# Patient Record
Sex: Female | Born: 2000 | Race: Black or African American | Hispanic: No | Marital: Single | Smoking: Never smoker
Health system: Southern US, Community
[De-identification: ages and names within clinical notes are randomized; demographics above are authoritative.]

## PROBLEM LIST (undated history)

## (undated) DIAGNOSIS — R519 Headache, unspecified: Secondary | ICD-10-CM

## (undated) DIAGNOSIS — K589 Irritable bowel syndrome without diarrhea: Secondary | ICD-10-CM

## (undated) DIAGNOSIS — H6690 Otitis media, unspecified, unspecified ear: Secondary | ICD-10-CM

## (undated) DIAGNOSIS — Z889 Allergy status to unspecified drugs, medicaments and biological substances status: Secondary | ICD-10-CM

## (undated) DIAGNOSIS — R51 Headache: Secondary | ICD-10-CM

## (undated) DIAGNOSIS — R011 Cardiac murmur, unspecified: Secondary | ICD-10-CM

## (undated) DIAGNOSIS — J45909 Unspecified asthma, uncomplicated: Secondary | ICD-10-CM

## (undated) HISTORY — PX: TOOTH EXTRACTION: SUR596

## (undated) HISTORY — PX: TYMPANOSTOMY TUBE PLACEMENT: SHX32

---
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
## 2021-09-20 ENCOUNTER — Ambulatory Visit (HOSPITAL_COMMUNITY)
Admission: EM | Admit: 2021-09-20 | Discharge: 2021-09-20 | Disposition: A | Discharge status: Home / Self Care | Payer: Medicaid Other | Attending: Family Medicine | Admitting: Family Medicine

## 2021-09-20 ENCOUNTER — Encounter (HOSPITAL_COMMUNITY): Payer: Self-pay | Admitting: Emergency Medicine

## 2021-09-20 ENCOUNTER — Ambulatory Visit (INDEPENDENT_AMBULATORY_CARE_PROVIDER_SITE_OTHER): Payer: Medicaid Other

## 2021-09-20 DIAGNOSIS — Z20822 Contact with and (suspected) exposure to covid-19: Secondary | ICD-10-CM | POA: Diagnosis not present

## 2021-09-20 DIAGNOSIS — R509 Fever, unspecified: Secondary | ICD-10-CM

## 2021-09-20 DIAGNOSIS — R3129 Other microscopic hematuria: Secondary | ICD-10-CM | POA: Insufficient documentation

## 2021-09-20 DIAGNOSIS — R809 Proteinuria, unspecified: Secondary | ICD-10-CM | POA: Insufficient documentation

## 2021-09-20 DIAGNOSIS — R059 Cough, unspecified: Secondary | ICD-10-CM | POA: Diagnosis not present

## 2021-09-20 DIAGNOSIS — I776 Arteritis, unspecified: Secondary | ICD-10-CM | POA: Diagnosis not present

## 2021-09-20 LAB — CBC WITH DIFFERENTIAL/PLATELET
Abs Immature Granulocytes: 0.01 10*3/uL (ref 0.00–0.07)
Basophils Absolute: 0 10*3/uL (ref 0.0–0.1)
Basophils Relative: 0 %
Eosinophils Absolute: 0.4 10*3/uL (ref 0.0–0.5)
Eosinophils Relative: 7 %
HCT: 44.2 % (ref 36.0–46.0)
Hemoglobin: 14.8 g/dL (ref 12.0–15.0)
Immature Granulocytes: 0 %
Lymphocytes Relative: 8 %
Lymphs Abs: 0.4 10*3/uL — ABNORMAL LOW (ref 0.7–4.0)
MCH: 28.9 pg (ref 26.0–34.0)
MCHC: 33.5 g/dL (ref 30.0–36.0)
MCV: 86.3 fL (ref 80.0–100.0)
Monocytes Absolute: 0.2 10*3/uL (ref 0.1–1.0)
Monocytes Relative: 4 %
Neutro Abs: 4.5 10*3/uL (ref 1.7–7.7)
Neutrophils Relative %: 81 %
Platelets: 219 10*3/uL (ref 150–400)
RBC: 5.12 MIL/uL — ABNORMAL HIGH (ref 3.87–5.11)
RDW: 13.3 % (ref 11.5–15.5)
WBC: 5.6 10*3/uL (ref 4.0–10.5)
nRBC: 0 % (ref 0.0–0.2)

## 2021-09-20 LAB — POCT URINALYSIS DIPSTICK, ED / UC
Glucose, UA: NEGATIVE mg/dL
Ketones, ur: 15 mg/dL — AB
Leukocytes,Ua: NEGATIVE
Nitrite: NEGATIVE
Protein, ur: >=300 mg/dL — AB
Specific Gravity, Urine: >=1.03 (ref 1.005–1.030)
Urobilinogen, UA: 1 mg/dL (ref 0.0–1.0)
pH: 6 (ref 5.0–8.0)

## 2021-09-20 LAB — COMPREHENSIVE METABOLIC PANEL
ALT: 15 U/L (ref 0–44)
AST: 31 U/L (ref 15–41)
Albumin: 4.5 g/dL (ref 3.5–5.0)
Alkaline Phosphatase: 63 U/L (ref 38–126)
Anion gap: 12 (ref 5–15)
BUN: 14 mg/dL (ref 6–20)
CO2: 19 mmol/L — ABNORMAL LOW (ref 22–32)
Calcium: 9 mg/dL (ref 8.9–10.3)
Chloride: 104 mmol/L (ref 98–111)
Creatinine, Ser: 1.64 mg/dL — ABNORMAL HIGH (ref 0.44–1.00)
GFR, Estimated: 45 mL/min — ABNORMAL LOW (ref >60)
Glucose, Bld: 96 mg/dL (ref 70–99)
Potassium: 3.7 mmol/L (ref 3.5–5.1)
Sodium: 135 mmol/L (ref 135–145)
Total Bilirubin: 0.6 mg/dL (ref 0.3–1.2)
Total Protein: 7.9 g/dL (ref 6.5–8.1)

## 2021-09-20 LAB — TSH: TSH: 0.155 u[IU]/mL — ABNORMAL LOW (ref 0.350–4.500)

## 2021-09-20 LAB — C-REACTIVE PROTEIN: CRP: 5 mg/dL — ABNORMAL HIGH (ref <1.0)

## 2021-09-20 MED ORDER — ACETAMINOPHEN 325 MG PO TABS
975.0000 mg | ORAL_TABLET | Freq: Once | ORAL | Status: AC
  Administered 2021-09-20: 975 mg via ORAL

## 2021-09-20 MED ORDER — ACETAMINOPHEN 325 MG PO TABS
ORAL_TABLET | ORAL | Status: AC
Start: 2021-09-20 — End: ?
  Filled 2021-09-20: qty 3

## 2021-09-20 MED ORDER — ACETAMINOPHEN 325 MG PO TABS
ORAL_TABLET | ORAL | Status: AC
  Filled 2021-09-20: qty 3

## 2021-09-20 NOTE — ED Triage Notes (Signed)
Pt reports was seen at [facility] on Friday for breakout on her back and was started on antibiotics. Reports that she started having body aches, chills, coughing yesterday. Called them back and was told that the symptoms were not related to antibiotics. Took tylenol and Mucinex yesterday.  ?

## 2021-09-20 NOTE — ED Provider Notes (Addendum)
?[place] ? ? ?[id number] ?09/20/21 Arrival Time: [id number] ? ?ASSESSMENT & PLAN: ? 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 Fever, unspecified fever cause   ?3. Proteinuria, unspecified type   ?4. Other microscopic hematuria   ? ?Without mucous membrane involvement. ?Will stop what I assume is Bactrim given her description of name of antibiotic. ? ?Odd distribution. Unclear etiology; question Ig A related. Less likely given age though. Without mucous membrane involvement, I'm not suspicious of SJS; rash also present before starting antibiotic. No eye involvement. Agrees to ED eval should her rash continue to worsen. ? ?Discussed need for PCP, esp to f/u on current issues and possible hyperthyroidism. ? ?Possible kidney involvement given elevated Cr. CRP elevated. Going to send in a Rx of prednisone. ?Since she does not have a PCP, she may return to UC within few days to recheck BMP and U/A. ?Ensure adeq hydration. ? ?I have personally viewed the imaging studies ordered this visit. ?CXR without acute changes. ? ?Tylenol only for fever control. ? ?Labs Reviewed  ?CBC WITH DIFFERENTIAL/PLATELET - Abnormal; Notable for the following components:  ?    Result Value  ? RBC 5.12 (*)   ? Lymphs Abs 0.4 (*)   ? All other components within normal limits  ?COMPREHENSIVE METABOLIC PANEL - Abnormal; Notable for the following components:  ? CO2 19 (*)   ? Creatinine, Ser 1.64 (*)   ? GFR, Estimated 45 (*)   ? All other components within normal limits  ?TSH - Abnormal; Notable for the following components:  ? TSH 0.155 (*)   ? All other components within normal limits  ?C-REACTIVE PROTEIN - Abnormal; Notable for the following components:  ? CRP 5.0 (*)   ? All other components within normal limits  ?POCT URINALYSIS DIPSTICK, ED / UC - Abnormal; Notable for the following components:  ? Bilirubin Urine MODERATE (*)   ? Ketones, ur 15 (*)   ? Hgb urine dipstick MODERATE (*)   ? Protein, ur >=300 (*)   ? All other components within normal  limits  ?SARS CORONAVIRUS 2 (TAT 6-24 HRS)  ?URINE CULTURE  ? ?Will have RN inform her of above results. ? ?Will follow up with PCP or here if worsening or failing to improve as anticipated. ?Reviewed expectations re: course of current medical issues. Questions answered. ?Outlined signs and symptoms indicating need for more acute intervention. ?Patient verbalized understanding. ?After Visit Summary given. ? ? ?SUBJECTIVE: ? ?Veronica Casey is a 21 y.o. female who presents with a skin complaint. "Rash"; x 1 week; over entire back and part of upper arms. No assoc pain or itching. Seen at [facility] and given an antibiotic for skin infection. "Starts with an 'S'"; does not have medication with her. Feels this has helped a little; has completed approx one week. Questions some new areas on arms. Subj fever today prompting her to come in. No oral or vaginal lesions. No other new medications. Without n/v/d. No h/o similar. Normal bowel/bladder habits. No CP/SOB. ?  ?OBJECTIVE: ?Vitals:  ? 09/20/21 1615  ?BP: 102/69  ?Pulse: (!) 126  ?Resp: 18  ?Temp: (!) 103.1 ?F (39.5 ?C)  ?TempSrc: Oral  ?SpO2: 97%  ?  ?Febrile. Tachycardia noted. ? ?General appearance: alert; no distress ?HEENT: [place]; AT ?Neck: supple with FROM ?Lungs: clear to auscultation bilaterally ?Heart: regular rate and rhythm ?Extremities: no edema; moves all extremities normally ?Skin:  ? ? ?Diffuse red/purple purpuric rash as shown above; non-tender ?Psychological: alert and cooperative;  normal mood and affect ? ?Allergies  ?Allergen Reactions  ? Ceftin [Cefuroxime Axetil] Anaphylaxis  ? Penicillins Anaphylaxis  ? Augmentin [Amoxicillin-Pot Clavulanate] Rash  ? ? ?Past Medical History:  ?Diagnosis Date  ? Asthma   ? Headache   ? Heart murmur   ? IBS (irritable bowel syndrome)   ? Multiple allergies   ? Otitis   ? ?Social History  ? ?Socioeconomic History  ? Marital status: Single  ?  Spouse name: Not on file  ? Number of children: Not on file  ? Years of  education: Not on file  ? Highest education level: Not on file  ?Occupational History  ? Not on file  ?Tobacco Use  ? Smoking status: Passive Smoke Exposure - Never Smoker  ? Smokeless tobacco: Not on file  ?Substance and Sexual Activity  ? Alcohol use: No  ? Drug use: No  ? Sexual activity: Not on file  ?Other Topics Concern  ? Not on file  ?Social History Narrative  ? Not on file  ? ?Social Determinants of Health  ? ?Financial Resource Strain: Not on file  ?Food Insecurity: Not on file  ?Transportation Needs: Not on file  ?Physical Activity: Not on file  ?Stress: Not on file  ?Social Connections: Not on file  ?Intimate Partner Violence: Not on file  ? ?Family History  ?Problem Relation Age of Onset  ? Cancer Maternal Grandmother   ? Heart disease Maternal Grandfather   ? Cancer Paternal Grandmother   ? ?Past Surgical History:  ?Procedure Laterality Date  ? TOOTH EXTRACTION    ? TYMPANOSTOMY TUBE PLACEMENT    ? ? ?  ?Vanessa Kick, MD ?09/21/21 1029 ? ?  ?Vanessa Kick, MD ?09/21/21 1031 ? ?

## 2021-09-20 NOTE — Discharge Instructions (Signed)
You have had labs (blood work and a urine culture) sent today. We will call you with any significant abnormalities or if there is need to begin or change treatment or pursue further follow up.  You may also review your test results online through MyChart. If you do not have a MyChart account, instructions to sign up should be on your discharge paperwork.  

## 2021-09-21 ENCOUNTER — Encounter (HOSPITAL_COMMUNITY): Payer: Self-pay | Admitting: Emergency Medicine

## 2021-09-21 ENCOUNTER — Other Ambulatory Visit: Payer: Self-pay

## 2021-09-21 ENCOUNTER — Emergency Department (HOSPITAL_COMMUNITY): Payer: Medicaid Other

## 2021-09-21 ENCOUNTER — Emergency Department (HOSPITAL_COMMUNITY)
Admission: EM | Admit: 2021-09-21 | Discharge: 2021-09-21 | Discharge status: Against Medical Advice | Payer: Medicaid Other | Attending: Emergency Medicine | Admitting: Emergency Medicine

## 2021-09-21 ENCOUNTER — Telehealth: Payer: Self-pay | Admitting: Family Medicine

## 2021-09-21 DIAGNOSIS — N179 Acute kidney failure, unspecified: Secondary | ICD-10-CM | POA: Diagnosis not present

## 2021-09-21 DIAGNOSIS — R0602 Shortness of breath: Secondary | ICD-10-CM | POA: Insufficient documentation

## 2021-09-21 DIAGNOSIS — J45909 Unspecified asthma, uncomplicated: Secondary | ICD-10-CM | POA: Insufficient documentation

## 2021-09-21 DIAGNOSIS — R21 Rash and other nonspecific skin eruption: Secondary | ICD-10-CM | POA: Insufficient documentation

## 2021-09-21 DIAGNOSIS — Z7722 Contact with and (suspected) exposure to environmental tobacco smoke (acute) (chronic): Secondary | ICD-10-CM | POA: Insufficient documentation

## 2021-09-21 DIAGNOSIS — R000 Tachycardia, unspecified: Secondary | ICD-10-CM | POA: Diagnosis not present

## 2021-09-21 LAB — CBC WITH DIFFERENTIAL/PLATELET
Abs Immature Granulocytes: 0 10*3/uL (ref 0.00–0.07)
Basophils Absolute: 0 10*3/uL (ref 0.0–0.1)
Basophils Relative: 1 %
Eosinophils Absolute: 0 10*3/uL (ref 0.0–0.5)
Eosinophils Relative: 2 %
HCT: 38.6 % (ref 36.0–46.0)
Hemoglobin: 13.1 g/dL (ref 12.0–15.0)
Immature Granulocytes: 0 %
Lymphocytes Relative: 22 %
Lymphs Abs: 0.4 10*3/uL — ABNORMAL LOW (ref 0.7–4.0)
MCH: 29.2 pg (ref 26.0–34.0)
MCHC: 33.9 g/dL (ref 30.0–36.0)
MCV: 86 fL (ref 80.0–100.0)
Monocytes Absolute: 0.1 10*3/uL (ref 0.1–1.0)
Monocytes Relative: 5 %
Neutro Abs: 1.2 10*3/uL — ABNORMAL LOW (ref 1.7–7.7)
Neutrophils Relative %: 70 %
Platelets: 205 10*3/uL (ref 150–400)
RBC: 4.49 MIL/uL (ref 3.87–5.11)
RDW: 13.2 % (ref 11.5–15.5)
WBC: 1.7 10*3/uL — ABNORMAL LOW (ref 4.0–10.5)
nRBC: 0 % (ref 0.0–0.2)

## 2021-09-21 LAB — COMPREHENSIVE METABOLIC PANEL
ALT: 19 U/L (ref 0–44)
AST: 35 U/L (ref 15–41)
Albumin: 4.3 g/dL (ref 3.5–5.0)
Alkaline Phosphatase: 58 U/L (ref 38–126)
Anion gap: 9 (ref 5–15)
BUN: 14 mg/dL (ref 6–20)
CO2: 21 mmol/L — ABNORMAL LOW (ref 22–32)
Calcium: 8.6 mg/dL — ABNORMAL LOW (ref 8.9–10.3)
Chloride: 105 mmol/L (ref 98–111)
Creatinine, Ser: 1.54 mg/dL — ABNORMAL HIGH (ref 0.44–1.00)
GFR, Estimated: 48 mL/min — ABNORMAL LOW (ref >60)
Glucose, Bld: 105 mg/dL — ABNORMAL HIGH (ref 70–99)
Potassium: 3.5 mmol/L (ref 3.5–5.1)
Sodium: 135 mmol/L (ref 135–145)
Total Bilirubin: 0.7 mg/dL (ref 0.3–1.2)
Total Protein: 7.2 g/dL (ref 6.5–8.1)

## 2021-09-21 LAB — URINALYSIS, ROUTINE W REFLEX MICROSCOPIC
Bacteria, UA: NONE SEEN
Bilirubin Urine: NEGATIVE
Glucose, UA: NEGATIVE mg/dL
Ketones, ur: 5 mg/dL — AB
Leukocytes,Ua: NEGATIVE
Nitrite: NEGATIVE
Protein, ur: 30 mg/dL — AB
Specific Gravity, Urine: 1.003 — ABNORMAL LOW (ref 1.005–1.030)
pH: 6 (ref 5.0–8.0)

## 2021-09-21 LAB — I-STAT BETA HCG BLOOD, ED (MC, WL, AP ONLY): I-stat hCG, quantitative: <5 m[IU]/mL (ref <5)

## 2021-09-21 LAB — APTT: aPTT: 41 seconds — ABNORMAL HIGH (ref 24–36)

## 2021-09-21 LAB — SARS CORONAVIRUS 2 (TAT 6-24 HRS): SARS Coronavirus 2: NEGATIVE

## 2021-09-21 LAB — C-REACTIVE PROTEIN: CRP: 2.9 mg/dL — ABNORMAL HIGH (ref <1.0)

## 2021-09-21 LAB — SEDIMENTATION RATE: Sed Rate: 5 mm/hr (ref 0–22)

## 2021-09-21 LAB — LACTIC ACID, PLASMA: Lactic Acid, Venous: 0.9 mmol/L (ref 0.5–1.9)

## 2021-09-21 MED ORDER — LACTATED RINGERS IV BOLUS (SEPSIS)
1000.0000 mL | Freq: Once | INTRAVENOUS | Status: AC
  Administered 2021-09-21: 1000 mL via INTRAVENOUS

## 2021-09-21 MED ORDER — METHYLPREDNISOLONE SODIUM SUCC 125 MG IJ SOLR
125.0000 mg | Freq: Once | INTRAMUSCULAR | Status: AC
  Administered 2021-09-21: 125 mg via INTRAVENOUS
  Filled 2021-09-21: qty 2

## 2021-09-21 MED ORDER — PREDNISONE 20 MG PO TABS: 40.0000 mg | ORAL_TABLET | Freq: Every day | ORAL | 0 refills | Status: DC

## 2021-09-21 MED ORDER — ACETAMINOPHEN 325 MG PO TABS
650.0000 mg | ORAL_TABLET | Freq: Once | ORAL | Status: AC
Start: 2021-09-21 — End: 2021-09-21
  Administered 2021-09-21: 650 mg via ORAL
  Filled 2021-09-21: qty 2

## 2021-09-21 NOTE — ED Provider Notes (Signed)
?[place] ?Provider Note ? ? ?CSN: [id number] ?Arrival date & time: 09/21/21  1044 ? ?  ? ?History ?Chief Complaint  ?Patient presents with  ? Shortness of Breath  ? ? 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  Patient was initially seen and evaluated for this rash that started on her upper back at urgent care.  She was given Bactrim.  After about a week.  She started having worsening symptoms including fevers and chills.  The rash is since progressed to the entire trunk of her body down the upper extremities and starting to progress to the lower extremities.  She was seen at urgent care again yesterday and had labs done.  I reviewed these labs which showed no leukocytosis.  CMP did show evidence of AKI.  TSH was low.  C-reactive protein was elevated.  Urinalysis showed bilirubinuria and moderate mount of blood.  No signs of infection. ? ?Patient presented because she is not feeling any better.  She says the rash is getting worse.  Upon a parking her car and coming to the emergency department to get checked and she became short of breath with exertion.  No chest pain.  She denies any abdominal pain, nausea, vomiting, diarrhea.  No sick contacts.  No new soaps or detergents or medications or foods. ? ? ?Shortness of Breath ? ?  ? ?Home Medications ?Prior to Admission medications   ?Medication Sig Start Date End Date Taking? Authorizing Provider  ?albuterol (PROVENTIL HFA;VENTOLIN HFA) 108 (90 BASE) MCG/ACT inhaler Inhale 1-2 puffs into the lungs every 6 (six) hours as needed for wheezing or shortness of breath.    [provider]  ?fluconazole (DIFLUCAN) 150 MG tablet Take 1 tablet (150 mg total) by mouth daily. 07/16/14   Jaclyn Prime, Collene Leyden, PA-C  ?fluticasone (FLONASE) 50  MCG/ACT nasal spray Place 1 spray into both nostrils daily as needed for allergies or rhinitis.    [provider]  ?loratadine (CLARITIN) 10 MG tablet Take 10 mg by mouth daily.    [provider]  ?Pediatric Multiple Vitamins (CHEWABLE MULTIPLE VITAMINS PO) Take 1 tablet by mouth daily.    [provider]  ?predniSONE (DELTASONE) 20 MG tablet Take 2 tablets (40 mg total) by mouth daily. 09/21/21   Vanessa Kick, MD  ?   ? ?Allergies    ?Ceftin [cefuroxime axetil], Penicillins, and Augmentin [amoxicillin-pot clavulanate]   ? ?Review of Systems   ?Review of Systems  ?Respiratory:  Positive for shortness of breath.   ?All other systems reviewed and are negative. ? ?Physical Exam ?Updated Vital Signs ?BP 100/87 (BP Location: Left Arm)   Pulse (!) 129   Temp (!) 101.2 ?F (38.4 ?C) (Oral)   Resp (!) 24   Ht 5' 2"  (1.575 m)   Wt 77.9 kg   LMP 09/15/2021   SpO2 100%   BMI 31.41 kg/m?  ?Physical Exam ?Vitals and nursing note reviewed.  ?Constitutional:   ?   General: She is in acute distress.  ?   Appearance: Normal appearance.  ?HENT:  ?   Head: Normocephalic and atraumatic.  ?   Mouth/Throat:  ?   Comments: No lesions over the oral mucosa. ?Eyes:  ?   General:     ?   Right eye: No  discharge.     ?   Left eye: No discharge.  ?   Comments: No lesions over the conjunctivae.  ?Cardiovascular:  ?   Rate and Rhythm: Tachycardia present.  ?   Comments: S1/S2 are distinct without any evidence of murmur, rubs, or gallops.  Radial pulses are 2+ bilaterally.  Dorsalis pedis pulses are 2+ bilaterally.  No evidence of pedal edema. ?Pulmonary:  ?   Effort: Tachypnea present.  ?   Breath sounds: Normal breath sounds.  ?Abdominal:  ?   General: Abdomen is flat. Bowel sounds are normal. There is no distension.  ?   Tenderness: There is no abdominal tenderness. There is no guarding or rebound.  ?Musculoskeletal:     ?   General: Normal range of motion.  ?   Cervical back: Neck supple.  ?Skin: ?    General: Skin is warm and dry.  ?   Findings: No rash.  ?   Comments: Erythematous nonpurulent, nontender, and nonpruritic papules scattered along the trunk.  There is evidence of spread however these are more macular and erythematous along the extremities.  ?Neurological:  ?   General: No focal deficit present.  ?   Mental Status: She is alert.  ?Psychiatric:     ?   Mood and Affect: Mood normal.     ?   Behavior: Behavior normal.  ? ? ? ? ? ? ? ? ? ? ?ED Results / Procedures / Treatments   ?Labs ?(all labs ordered are listed, but only abnormal results are displayed) ?Labs Reviewed  ?CULTURE, BLOOD (ROUTINE X 2)  ?CULTURE, BLOOD (ROUTINE X 2)  ?URINE CULTURE  ?LACTIC ACID, PLASMA  ?LACTIC ACID, PLASMA  ?CBC WITH DIFFERENTIAL/PLATELET  ?APTT  ?URINALYSIS, ROUTINE W REFLEX MICROSCOPIC  ?C-REACTIVE PROTEIN  ?SEDIMENTATION RATE  ?I-STAT BETA HCG BLOOD, ED ([facility], [facility], [facility] ONLY)  ?I-STAT BETA HCG BLOOD, ED ([facility], [facility], [facility] ONLY)  ? ? ?EKG ?None ? ?Radiology ?DG Chest 2 View ? ?Result Date: 09/20/2021 ?CLINICAL DATA:  Fever and cough. EXAM: CHEST - 2 VIEW COMPARISON:  None. FINDINGS: Cardiac silhouette and mediastinal contours are within normal limits. The lungs are clear. No pleural effusion or pneumothorax. No acute skeletal abnormality. IMPRESSION: No active cardiopulmonary disease. Electronically Signed   By: Yvonne Kendall M.D.   On: 09/20/2021 17:22   ? ?Procedures ?Procedures  ? ? ?Medications Ordered in ED ?Medications  ?lactated ringers bolus 1,000 mL (has no administration in time range)  ?acetaminophen (TYLENOL) tablet 650 mg (has no administration in time range)  ?methylPREDNISolone sodium succinate (SOLU-MEDROL) 125 mg/2 mL injection 125 mg (has no administration in time range)  ? ? ?ED Course/ Medical Decision Making/ A&P ?Clinical Course as of 09/21/21 1500  ?Thu Sep 21, 2021  ?1403 On reevaluation, patient was sleeping in no acute distress.  Vital signs have completely normalized.  Tachycardia has improved and heart  rate is currently in the 90s.  Respirations are normal.  Blood pressure continues to be normal. [CF]  ?  ?Clinical Course User Index ?[CF] Myna Bright M, PA-C  ? ?                        ?Medical Decision Making ?Amount and/or Complexity of Data Reviewed ?Labs: ordered. ?Radiology: ordered. ?ECG/medicine tests: ordered. ? ?Risk ?OTC drugs. ?Prescription drug management. ? ? ?This patient presents to the ED for concern of fever and rash, this involves an extensive number of treatment options, and  is a complaint that carries with it a high risk of complications and morbidity.  The differential diagnosis includes vasculitis, viral exanthem, dermatitis. ? ? ?Co morbidities that complicate the patient evaluation ? ?Past Medical History:  ?Diagnosis Date  ? Asthma   ? Headache   ? Heart murmur   ? IBS (irritable bowel syndrome)   ? Multiple allergies   ? Otitis   ? ? ?Additional history obtained: ? ?Additional history obtained from nursing note ?External records from outside source obtained and reviewed including urgent care note from yesterday which is highlighted in the HPI. ? ? ?Lab Tests: ? ?I Ordered, and personally interpreted labs.  The pertinent results include: CRP is 2.9 today but downtrending in comparison to yesterday's.  Initial coagulation studies are prolonged.  CMP shows elevated glucose and creatinine but creatinine is better in comparison to yesterday's.  ESR is normal.  Pregnancy negative.  Initial lactic normal.  CBC is pending.  Secondary lactic is pending.  Blood cultures were obtained and are in process. ? ? ?Imaging Studies ordered: ? ?I ordered imaging studies including chest x-ray ?I independently visualized and interpreted imaging which showed no acute findings ?I agree with the radiologist interpretation ? ? ?Cardiac Monitoring: ? ?The patient was maintained on a cardiac monitor.  I personally viewed and interpreted the cardiac monitored which showed an underlying rhythm of: Normal sinus  rhythm ? ? ?Medicines ordered and prescription drug management: ? ?I ordered medication including Solu-Medrol for rash, fluids for fever and tachycardia ?Reevaluation of the patient after these medicines showed that the pa

## 2021-09-21 NOTE — ED Notes (Signed)
Ambulated to the restroom but no sample provided  ?

## 2021-09-21 NOTE — ED Provider Notes (Signed)
Care of patient handed of to me at this time from [place], [place]. See his note for work-up thus far. Briefly, this is a 21 year old female who presents to the emergency department with rash, fever and chills. She was seen at [facility] on 09/13/21 with rash on her back that started after receiving treatment of her keloids. They diagnosed her with bacterial folliculitis and she was started on Bactrim. She states that the rash worsened after starting bactrim, she went back to urgent care yesterday and they told her to stop Bactrim and questioned whether she had vasculitis. Yesterday she had elevated CRP, AKI. Instructed to present to ED if symptoms worsened and started her on prednisone. She states today she has worsening of her rash, fever, and had shortness of breath this morning. Presented here with 101 fever, tachycardia. ?Physical Exam  ?BP 92/67   Pulse 96   Temp 99.5 ?F (37.5 ?C) (Oral)   Resp (!) 22   Ht _0  (1.575 m)   Wt 77.9 kg   LMP 09/15/2021 (Approximate)   SpO2 100%   BMI 31.41 kg/m?  ? ?Physical Exam ?Vitals and nursing note reviewed.  ?Constitutional:   ?   General: She is not in acute distress. ?   Appearance: Normal appearance. She is well-developed. She is obese. She is not toxic-appearing.  ?HENT:  ?   Head: Normocephalic.  ?   Mouth/Throat:  ?   Mouth: Mucous membranes are moist.  ?   Pharynx: Oropharynx is clear.  ?   Comments: No oral lesions  ?Eyes:  ?   General: No scleral icterus. ?   Extraocular Movements: Extraocular movements intact.  ?   Pupils: Pupils are equal, round, and reactive to light.  ?Cardiovascular:  ?   Rate and Rhythm: Normal rate and regular rhythm.  ?   Pulses: Normal pulses.  ?   Heart sounds: No murmur heard. ?Pulmonary:  ?   Effort: Pulmonary effort is normal. No tachypnea.  ?   Breath sounds: Normal breath sounds. No stridor.  ?Abdominal:  ?   General: Bowel sounds are normal.  ?   Palpations: Abdomen is soft.  ?Musculoskeletal:     ?   General: Normal range of motion.   ?   Cervical back: Neck supple.  ?   Right lower leg: No edema.  ?   Left lower leg: No edema.  ?Skin: ?   General: Skin is warm and dry.  ?   Capillary Refill: Capillary refill takes less than 2 seconds.  ?   Findings: Erythema and rash present.  ?   Comments: Diffuse maculopapular rash to the trunk, arms and legs. No involvement of mucous membranes   ?Neurological:  ?   General: No focal deficit present.  ?   Mental Status: She is alert and oriented to person, place, and time.  ?Psychiatric:     ?   Mood and Affect: Mood normal.     ?   Behavior: Behavior normal.  ? ? ?Procedures  ?Procedures ? ?ED Course / MDM  ? ?Clinical Course as of 09/21/21 2015  ?Thu Sep 21, 2021  ?1403 On reevaluation, patient was sleeping in no acute distress.  Vital signs have completely normalized.  Tachycardia has improved and heart rate is currently in the 90s.  Respirations are normal.  Blood pressure continues to be normal. [CF]  ?1821 CBC with Differential [LA]  ?  ?Clinical Course User Index ?[CF] Hendricks Limes, PA-C ?[LA] Mickie Hillier,  PA-C  ? ?Medical Decision Making ?Amount and/or Complexity of Data Reviewed ?Labs: ordered. Decision-making details documented in ED Course. ?Radiology: ordered. ?ECG/medicine tests: ordered. ? ?Risk ?OTC drugs. ?Prescription drug management. ? ? ?21 year old female who presents to the emergency department with rash, fever, shortness of breath. Progressing symptoms over 2 weeks. ?I reviewed her dermatology records from [facility]. It appears she was started on Kenalog injections, flagyl gel, doxycycline PO, hydroquinone cream, tranexamic acid.  ? ?Labs here show ?AKI with Cr 1.54, UA with protein  ?Elevated CRP 2.9, ESR normal  ?Lactic 0.9 ?CBC with  ? ?Rash  ?- ? Drug interaction from dermatology  ?- ? Vasculitis, she has renal involvement. AKI could be from insensible losses but skeptical.  ? ?Given LR, solumedrol and tylenol  ? ?2010: CBC with leukopenia. ?2015: spoke with Sheran Luz, MD  hospitalist who will come evaluate the patient but requests contact with [facility] as we do not have dermatology or rheumatology here at [facility].  ?2045: Contacted [facility] who state that they do not have a bed available. ?2100: Contacted [facility] and spoke with a triage nurse who states that they will call me back ?2130: Patient requesting to leave.  I had a very long discussion with her about the risks of this and that we do not fully know what is going on with her rash and kidney injury.  Discussed that it is likely something autoimmune and she needs further care from her rheumatologist in a dermatologist which we do not have here.  I have asked that she think about the decision to leave and that she will have to leave [place] if she so chooses. ?1000: Patient request to leave [place]. ?The patient has decided to leave against medical advice.  ?The patient had a normal mental status examination and understands his/ ?her condition and the risks of leaving including worsening of her condition or permanent disability and death. The patient has had an opportunity to ask questions about his/her medical condition. The patient has been informed that he/she may return for care at any time and has been referred to his/her primary care physician and dermatologist. ?  ?Mickie Hillier, PA-C ?09/21/21 2205 ? ?  ?Tegeler, Gwenyth Allegra, MD ?09/22/21 0023 ? ?

## 2021-09-21 NOTE — ED Triage Notes (Signed)
Pt seen at [facility] yesterday due possible allergic reaction to antibiotics. Pt reports increased ShOB and rash today, pt unable to speak in full sentences in triage.  ?

## 2021-09-21 NOTE — ED Notes (Signed)
Patient is resting in bed eating dinner provided by family. Patient is alert and oriented and calm. Call bell remains in reach.  ?

## 2021-09-21 NOTE — Discharge Instructions (Signed)
You are seen in the department today for a rash.  While you are here we did a work-up that showed that you also have an injury to your kidneys.  You may have an autoimmune condition where your body is attacking your kidneys for an unknown reason.  We were going to transfer you to a facility that has a dermatologist and rheumatologist stated that you can get the care you need.  You requested to leave AGAINST MEDICAL ADVICE.  We had a long discussion about the risks of this including worsening of your condition or death.  Please return for any concerns. ?

## 2021-09-21 NOTE — Telephone Encounter (Signed)
Sending in Rx prednisone. ?

## 2021-09-22 LAB — URINE CULTURE: Culture: NO GROWTH

## 2021-09-28 LAB — CULTURE, BLOOD (ROUTINE X 2)
Culture: NO GROWTH
Culture: NO GROWTH

## 2021-10-04 ENCOUNTER — Ambulatory Visit (HOSPITAL_COMMUNITY)
Admission: RE | Admit: 2021-10-04 | Discharge: 2021-10-04 | Disposition: A | Discharge status: Home / Self Care | Payer: Medicaid Other | Source: Ambulatory Visit | Attending: Family Medicine | Admitting: Family Medicine

## 2021-10-04 ENCOUNTER — Encounter (HOSPITAL_COMMUNITY): Payer: Self-pay

## 2021-10-04 ENCOUNTER — Other Ambulatory Visit: Payer: Self-pay

## 2021-10-04 VITALS — BP 115/76 | HR 64 | Temp 98.7°F | Resp 20

## 2021-10-04 DIAGNOSIS — I776 Arteritis, unspecified: Secondary | ICD-10-CM | POA: Insufficient documentation

## 2021-10-04 LAB — COMPREHENSIVE METABOLIC PANEL
ALT: 24 U/L (ref 0–44)
AST: 16 U/L (ref 15–41)
Albumin: 4.6 g/dL (ref 3.5–5.0)
Alkaline Phosphatase: 62 U/L (ref 38–126)
Anion gap: 9 (ref 5–15)
BUN: 8 mg/dL (ref 6–20)
CO2: 27 mmol/L (ref 22–32)
Calcium: 9.6 mg/dL (ref 8.9–10.3)
Chloride: 106 mmol/L (ref 98–111)
Creatinine, Ser: 0.81 mg/dL (ref 0.44–1.00)
GFR, Estimated: >60 mL/min (ref >60)
Glucose, Bld: 84 mg/dL (ref 70–99)
Potassium: 3.7 mmol/L (ref 3.5–5.1)
Sodium: 142 mmol/L (ref 135–145)
Total Bilirubin: 1.6 mg/dL — ABNORMAL HIGH (ref 0.3–1.2)
Total Protein: 7.5 g/dL (ref 6.5–8.1)

## 2021-10-04 LAB — CBC WITH DIFFERENTIAL/PLATELET
Abs Immature Granulocytes: 0.01 10*3/uL (ref 0.00–0.07)
Basophils Absolute: 0 10*3/uL (ref 0.0–0.1)
Basophils Relative: 0 %
Eosinophils Absolute: 0.1 10*3/uL (ref 0.0–0.5)
Eosinophils Relative: 1 %
HCT: 40.4 % (ref 36.0–46.0)
Hemoglobin: 13.2 g/dL (ref 12.0–15.0)
Immature Granulocytes: 0 %
Lymphocytes Relative: 33 %
Lymphs Abs: 1.9 10*3/uL (ref 0.7–4.0)
MCH: 29.1 pg (ref 26.0–34.0)
MCHC: 32.7 g/dL (ref 30.0–36.0)
MCV: 89 fL (ref 80.0–100.0)
Monocytes Absolute: 0.4 10*3/uL (ref 0.1–1.0)
Monocytes Relative: 7 %
Neutro Abs: 3.4 10*3/uL (ref 1.7–7.7)
Neutrophils Relative %: 59 %
Platelets: 534 10*3/uL — ABNORMAL HIGH (ref 150–400)
RBC: 4.54 MIL/uL (ref 3.87–5.11)
RDW: 13 % (ref 11.5–15.5)
WBC: 5.8 10*3/uL (ref 4.0–10.5)
nRBC: 0 % (ref 0.0–0.2)

## 2021-10-04 NOTE — Discharge Instructions (Addendum)
You have had labs (blood work) drawn today. We will call you with any significant abnormalities or if there is need to begin or change treatment or pursue further follow up.  You may also review your test results online through MyChart. If you do not have a MyChart account, instructions to sign up should be on your discharge paperwork.  

## 2021-10-04 NOTE — ED Triage Notes (Signed)
Patient was seen in ed and left prior to getting diagnosis and plan of care-left ama.   09/21/2021 ? ?patient here today for follow up to that visit.  Patient continues to have pain in left lower back.   ?

## 2021-10-05 NOTE — ED Provider Notes (Signed)
?[facility] ? ? ?[id number] ?10/04/21 Arrival Time: 1718 ? ?ASSESSMENT & PLAN: ? ?1. Vasculitis (HCC)   ? ?Results for orders placed or performed during the hospital encounter of 10/04/21  ?CBC with Differential/Platelet  ?Result Value Ref Range  ? WBC 5.8 4.0 - 10.5 K/uL  ? RBC 4.54 3.87 - 5.11 MIL/uL  ? Hemoglobin 13.2 12.0 - 15.0 g/dL  ? HCT 40.4 36.0 - 46.0 %  ? MCV 89.0 80.0 - 100.0 fL  ? MCH 29.1 26.0 - 34.0 pg  ? MCHC 32.7 30.0 - 36.0 g/dL  ? RDW 13.0 11.5 - 15.5 %  ? Platelets 534 (H) 150 - 400 K/uL  ? nRBC 0.0 0.0 - 0.2 %  ? Neutrophils Relative % 59 %  ? Neutro Abs 3.4 1.7 - 7.7 K/uL  ? Lymphocytes Relative 33 %  ? Lymphs Abs 1.9 0.7 - 4.0 K/uL  ? Monocytes Relative 7 %  ? Monocytes Absolute 0.4 0.1 - 1.0 K/uL  ? Eosinophils Relative 1 %  ? Eosinophils Absolute 0.1 0.0 - 0.5 K/uL  ? Basophils Relative 0 %  ? Basophils Absolute 0.0 0.0 - 0.1 K/uL  ? Immature Granulocytes 0 %  ? Abs Immature Granulocytes 0.01 0.00 - 0.07 K/uL  ?Comprehensive metabolic panel  ?Result Value Ref Range  ? Sodium 142 135 - 145 mmol/L  ? Potassium 3.7 3.5 - 5.1 mmol/L  ? Chloride 106 98 - 111 mmol/L  ? CO2 27 22 - 32 mmol/L  ? Glucose, Bld 84 70 - 99 mg/dL  ? BUN 8 6 - 20 mg/dL  ? Creatinine, Ser 0.81 0.44 - 1.00 mg/dL  ? Calcium 9.6 8.9 - 10.3 mg/dL  ? Total Protein 7.5 6.5 - 8.1 g/dL  ? Albumin 4.6 3.5 - 5.0 g/dL  ? AST 16 15 - 41 U/L  ? ALT 24 0 - 44 U/L  ? Alkaline Phosphatase 62 38 - 126 U/L  ? Total Bilirubin 1.6 (H) 0.3 - 1.2 mg/dL  ? GFR, Estimated >60 >60 mL/min  ? Anion gap 9 5 - 15  ? ?White count has returned to normal. Elevated platelet count likely acute phase reactant. Cr now normal. Rash is improving. Has dermatology f/u next week. ?Still needs PCP f/u concerning proteinuria seen previously and possible hyperthyroidism with low TSH. She plans to schedule. ? ?No specific treatment needed at this time. ? ?Will follow up with PCP or here if worsening or failing to improve as anticipated. ?Reviewed  expectations re: course of current medical issues. Questions answered. ?Outlined signs and symptoms indicating need for more acute intervention. ?Patient verbalized understanding. ?After Visit Summary given. ? ? ?SUBJECTIVE: ? ?Veronica Casey is a 21 y.o. female who presents "to follow up on my labs". Left ED previously AMA after WBC count found to be low in presence of likely vasculitis. Reports she is feeling well. Rash is improving. ?ED notes reviewed today. ? ?OBJECTIVE: ?Vitals:  ? 10/04/21 1737  ?BP: 115/76  ?Pulse: 64  ?Resp: 20  ?Temp: 98.7 ?F (37.1 ?C)  ?TempSrc: Oral  ?SpO2: 98%  ?  ?General appearance: alert; no distress ?HEENT: [place]; AT ?Neck: supple with FROM ?Lungs: clear to auscultation bilaterally ?Heart: regular ?Extremities: no edema; moves all extremities normally ?Skin: warm and dry; purpuric rash over back is much better; healing; no signs of skin infection ?Psychological: alert and cooperative; normal mood and affect ? ?Allergies  ?Allergen Reactions  ? Ceftin [Cefuroxime Axetil] Anaphylaxis  ? Penicillins Anaphylaxis  ?  Augmentin [Amoxicillin-Pot Clavulanate] Rash  ? ? ?Past Medical History:  ?Diagnosis Date  ? Asthma   ? Headache   ? Heart murmur   ? IBS (irritable bowel syndrome)   ? Multiple allergies   ? Otitis   ? ?Social History  ? ?Socioeconomic History  ? Marital status: Single  ?  Spouse name: Not on file  ? Number of children: Not on file  ? Years of education: Not on file  ? Highest education level: Not on file  ?Occupational History  ? Not on file  ?Tobacco Use  ? Smoking status: Never  ?  Passive exposure: Yes  ? Smokeless tobacco: Not on file  ?Vaping Use  ? Vaping Use: Never used  ?Substance and Sexual Activity  ? Alcohol use: No  ? Drug use: No  ? Sexual activity: Not on file  ?Other Topics Concern  ? Not on file  ?Social History Narrative  ? Not on file  ? ?Social Determinants of Health  ? ?Financial Resource Strain: Not on file  ?Food Insecurity: Not on file   ?Transportation Needs: Not on file  ?Physical Activity: Not on file  ?Stress: Not on file  ?Social Connections: Not on file  ?Intimate Partner Violence: Not on file  ? ?Family History  ?Problem Relation Age of Onset  ? Pancreatitis Mother   ? Hypertension Father   ? Cancer Maternal Grandmother   ? Heart disease Maternal Grandfather   ? Cancer Paternal Grandmother   ? ?Past Surgical History:  ?Procedure Laterality Date  ? TOOTH EXTRACTION    ? TYMPANOSTOMY TUBE PLACEMENT    ? ? ?  ?Mardella Layman, MD ?10/05/21 [phone] ? ?

## 2021-11-07 ENCOUNTER — Ambulatory Visit: Payer: Self-pay

## 2021-11-07 ENCOUNTER — Ambulatory Visit (HOSPITAL_COMMUNITY)
Admission: EM | Admit: 2021-11-07 | Discharge: 2021-11-07 | Disposition: A | Discharge status: Home / Self Care | Payer: Medicaid Other | Attending: Student | Admitting: Student

## 2021-11-07 ENCOUNTER — Other Ambulatory Visit: Payer: Self-pay

## 2021-11-07 ENCOUNTER — Encounter (HOSPITAL_COMMUNITY): Payer: Self-pay | Admitting: Emergency Medicine

## 2021-11-07 DIAGNOSIS — Z880 Allergy status to penicillin: Secondary | ICD-10-CM

## 2021-11-07 DIAGNOSIS — J020 Streptococcal pharyngitis: Secondary | ICD-10-CM

## 2021-11-07 LAB — POCT RAPID STREP A, ED / UC: Streptococcus, Group A Screen (Direct): POSITIVE — AB

## 2021-11-07 MED ORDER — AZITHROMYCIN 250 MG PO TABS: 250.0000 mg | ORAL_TABLET | Freq: Every day | ORAL | 0 refills | Status: DC

## 2021-11-07 NOTE — Discharge Instructions (Signed)
-  Start the antibiotic-Azithromycin x5 days.  -You can continue tylenol/ibuprofen for discomfort, and make sure to drink plenty of fluids -You'll still be contagious for 24 hours after starting the antibiotic. This means you can go back to work in 1 day.  -Make sure to throw out your toothbrush after 24 hours so you don't give the strep back to yourself.  -Seek additional medical attention if symptoms are getting worse instead of better- trouble swallowing, shortness of breath, voice changes, etc.

## 2021-11-07 NOTE — ED Provider Notes (Signed)
[facility]    CSN: [id number] Arrival date & time: 11/07/21  1632      History   Chief Complaint Chief Complaint  Patient presents with   Sore Throat    HPI Veronica Casey is a 21 y.o. female presenting with sore throat for 1 day.  History noncontributory.  She states that she had a cough and congestion last week, the cough has improved, but now with the sore throat.  Pain with swallowing.  Denies known fevers.  Denies nausea, vomiting, diarrhea.  Tolerating fluids and food. Exposure to boyfriend who has strep.  HPI  Past Medical History:  Diagnosis Date   Asthma    Headache    Heart murmur    IBS (irritable bowel syndrome)    Multiple allergies    Otitis     There are no problems to display for this patient.   Past Surgical History:  Procedure Laterality Date   TOOTH EXTRACTION     TYMPANOSTOMY TUBE PLACEMENT      OB History     Gravida  0   Para  0   Term  0   Preterm  0   AB  0   Living  0      SAB  0   IAB  0   Ectopic  0   Multiple  0   Live Births               Home Medications    Prior to Admission medications   Medication Sig Start Date End Date Taking? Authorizing Provider  azithromycin (ZITHROMAX Z-PAK) 250 MG tablet Take 1 tablet (250 mg total) by mouth daily. Two pills (500mg ) day 1. One pill per day (250mg ) days 2-5. 11/07/21  Yes , PA-C    Family History Family History  Problem Relation Age of Onset   Pancreatitis Mother    Hypertension Father    Cancer Maternal Grandmother    Heart disease Maternal Grandfather    Cancer Paternal Grandmother     Social History Social History   Tobacco Use   Smoking status: Never    Passive exposure: Yes  Vaping Use   Vaping Use: Never used  Substance Use Topics   Alcohol use: No   Drug use: No     Allergies   Ceftin [cefuroxime axetil], Penicillins, and Augmentin [amoxicillin-pot clavulanate]   Review of Systems Review of  Systems  Constitutional:  Negative for appetite change, chills and fever.  HENT:  Positive for sore throat. Negative for congestion, ear pain, rhinorrhea, sinus pressure and sinus pain.   Eyes:  Negative for redness and visual disturbance.  Respiratory:  Negative for cough, chest tightness, shortness of breath and wheezing.   Cardiovascular:  Negative for chest pain and palpitations.  Gastrointestinal:  Negative for abdominal pain, constipation, diarrhea, nausea and vomiting.  Genitourinary:  Negative for dysuria, frequency and urgency.  Musculoskeletal:  Negative for myalgias.  Neurological:  Negative for dizziness, weakness and headaches.  Psychiatric/Behavioral:  Negative for confusion.   All other systems reviewed and are negative.    Physical Exam Triage Vital Signs ED Triage Vitals  Enc Vitals Group     BP 11/07/21 1709 125/83     Pulse Rate 11/07/21 1709 99     Resp 11/07/21 1709 20     Temp 11/07/21 1709 99.2 F (37.3 C)     Temp Source 11/07/21 1709 Oral     SpO2 11/07/21 1709 98 %  Weight --      Height --      Head Circumference --      Peak Flow --      Pain Score 11/07/21 1707 5     Pain Loc --      Pain Edu? --      Excl. in GC? --    No data found.  Updated Vital Signs BP 125/83 (BP Location: Left Arm) Comment (BP Location): large cuff  Pulse 99   Temp 99.2 F (37.3 C) (Oral)   Resp 20   LMP 10/15/2021 (Exact Date)   SpO2 98%   Visual Acuity Right Eye Distance:   Left Eye Distance:   Bilateral Distance:    Right Eye Near:   Left Eye Near:    Bilateral Near:     Physical Exam Vitals reviewed.  Constitutional:      Appearance: Normal appearance. She is not ill-appearing.  HENT:     Head: Normocephalic and atraumatic.     Right Ear: Hearing, tympanic membrane, ear canal and external ear normal. No swelling or tenderness. No middle ear effusion. There is no impacted cerumen. No mastoid tenderness. Tympanic membrane is not injected, scarred,  perforated, erythematous, retracted or bulging.     Left Ear: Hearing, tympanic membrane, ear canal and external ear normal. No swelling or tenderness.  No middle ear effusion. There is no impacted cerumen. No mastoid tenderness. Tympanic membrane is not injected, scarred, perforated, erythematous, retracted or bulging.     Mouth/Throat:     Pharynx: Oropharynx is clear. Posterior oropharyngeal erythema present. No oropharyngeal exudate.     Tonsils: 2+ on the right. 2+ on the left.     Comments: Tonsils 2+ bilaterally with erythema but no exudate. On exam, uvula is midline, she is tolerating her secretions without difficulty, there is no trismus, no drooling, she has normal phonation  Cardiovascular:     Rate and Rhythm: Normal rate and regular rhythm.     Heart sounds: Normal heart sounds.  Pulmonary:     Effort: Pulmonary effort is normal.     Breath sounds: Normal breath sounds.  Lymphadenopathy:     Cervical: No cervical adenopathy.  Neurological:     General: No focal deficit present.     Mental Status: She is alert and oriented to person, place, and time.  Psychiatric:        Mood and Affect: Mood normal.        Behavior: Behavior normal.        Thought Content: Thought content normal.        Judgment: Judgment normal.      UC Treatments / Results  Labs (all labs ordered are listed, but only abnormal results are displayed) Labs Reviewed  POCT RAPID STREP A, ED / UC - Abnormal; Notable for the following components:      Result Value   Streptococcus, Group A Screen (Direct) POSITIVE (*)    All other components within normal limits    EKG   Radiology No results found.  Procedures Procedures (including critical care time)  Medications Ordered in UC Medications - No data to display  Initial Impression / Assessment and Plan / UC Course  I have reviewed the triage vital signs and the nursing notes.  Pertinent labs & imaging results that were available during my  care of the patient were reviewed by me and considered in my medical decision making (see chart for details).     This patient  is a very pleasant 21 y.o. year old female presenting with strep pharyngitis. Afebrile, nontachy.  Today on exam there is no asymmetry, low suspicion for deep space infection.  No evidence of bacteremia, sepsis. Penicillin allergic - anaphylaxis. Azithromycin sent. ED return precautions discussed. Patient verbalizes understanding and agreement.  .   Final Clinical Impressions(s) / UC Diagnoses   Final diagnoses:  Strep pharyngitis  Penicillin allergy     Discharge Instructions      -Start the antibiotic-Azithromycin x5 days.  -You can continue tylenol/ibuprofen for discomfort, and make sure to drink plenty of fluids -You'll still be contagious for 24 hours after starting the antibiotic. This means you can go back to work in 1 day.  -Make sure to throw out your toothbrush after 24 hours so you don't give the strep back to yourself.  -Seek additional medical attention if symptoms are getting worse instead of better- trouble swallowing, shortness of breath, voice changes, etc.    ED Prescriptions     Medication Sig Dispense Auth. Provider   azithromycin (ZITHROMAX Z-PAK) 250 MG tablet Take 1 tablet (250 mg total) by mouth daily. Two pills (500mg ) day 1. One pill per day (250mg ) days 2-5. 6 tablet , PA-C      PDMP not reviewed this encounter.   , PA-C 11/07/21 1809

## 2021-11-07 NOTE — ED Triage Notes (Signed)
Woke with a sore throat today.  Reports throat feels on fire.  Boyfriend has strep throat.  Patient was seen 10/04/2021 for flank pain.  Reports she continues to have strep pain.

## 2022-01-04 ENCOUNTER — Ambulatory Visit: Payer: Medicaid Other | Admitting: Family Medicine

## 2022-01-04 ENCOUNTER — Encounter: Payer: Self-pay | Admitting: Family Medicine

## 2022-01-04 VITALS — BP 116/78 | HR 74 | Temp 98.7°F | Wt 180.0 lb

## 2022-01-04 DIAGNOSIS — Z7689 Persons encountering health services in other specified circumstances: Secondary | ICD-10-CM

## 2022-01-04 DIAGNOSIS — Z8709 Personal history of other diseases of the respiratory system: Secondary | ICD-10-CM | POA: Diagnosis not present

## 2022-01-04 DIAGNOSIS — R3989 Other symptoms and signs involving the genitourinary system: Secondary | ICD-10-CM

## 2022-01-04 DIAGNOSIS — R350 Frequency of micturition: Secondary | ICD-10-CM | POA: Diagnosis not present

## 2022-01-04 LAB — POCT URINALYSIS DIPSTICK
Bilirubin, UA: NEGATIVE
Glucose, UA: NEGATIVE
Ketones, UA: NEGATIVE
Protein, UA: POSITIVE — AB
Spec Grav, UA: >=1.03 — AB (ref 1.010–1.025)
Urobilinogen, UA: 1 E.U./dL
pH, UA: 6 (ref 5.0–8.0)

## 2022-01-04 MED ORDER — NITROFURANTOIN MONOHYD MACRO 100 MG PO CAPS: 100.0000 mg | ORAL_CAPSULE | Freq: Two times a day (BID) | ORAL | 0 refills | Status: DC

## 2022-01-04 NOTE — Patient Instructions (Addendum)
Your urine looks like you may have a urinary tract infection.  We will try to send a sample to the lab for culture to see what bacteria is any it grows.  A prescription for Macrobid an antibiotic was sent to your pharmacy.  Given your history of allergies to antibiotics monitor closely for any symptoms of allergic reaction including, itching, swelling.  For concern of allergic reaction call 911/proceed to nearest ED.

## 2022-01-04 NOTE — Progress Notes (Signed)
Patient presents to clinic today to establish care.  SUBJECTIVE: PMH: Pt is a 21 yo female with pmh sig for h/o asthma.  Pt previously seen at [facility] in [place], [place].  Bladder concern: -Intermittent increased urgency and pressure -Has occurred a few times in the last few weeks -Patient denies increased caffeine intake, dysuria, back pain, nausea, vomiting, constipation, nocturia, changes in urine color.  H/o asthma: -no recent sx -not on meds  Seasonal allergies: -OTC benadryl prn -Cats, dust  Allergies: Penicillin-anaphylaxis.  Rash, throat swelling Contrast-anaphylaxis  Past surgical history: C-section-2021  Social history: Patient is single.  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  Patient has a son.  Endorses social alcohol use.  Denies tobacco and drug use.  Health Maintenance: Dental --night and day dental Vision --my eye doctor Immunizations --influenza vaccine 2022. LMP--01/02/2022 Colonoscopy -- Mammogram -- PAP --  Bone Density --  Family medical history: Mom-alive, asthma, miscarriage, CVA Dad-HLD, HTN MGM alive MGF-alive, prostate cancer, diabetes PGM-alive, DM, HTN PGF-deceased, HTN  Past Medical History:  Diagnosis Date   Asthma    Headache    Heart murmur    IBS (irritable bowel syndrome)    Multiple allergies    Otitis     Past Surgical History:  Procedure Laterality Date   TOOTH EXTRACTION     TYMPANOSTOMY TUBE PLACEMENT      Current Outpatient Medications on File Prior to Visit  Medication Sig Dispense Refill   acetaminophen (TYLENOL) 500 MG tablet Take by mouth.     azithromycin (ZITHROMAX Z-PAK) 250 MG tablet Take 1 tablet (250 mg total) by mouth daily. Two pills (500mg ) day 1. One pill per day (250mg ) days 2-5. (Patient not taking: Reported on 01/04/2022) 6 tablet 0   No current facility-administered medications on file prior to visit.    Allergies   Allergen Reactions   Ceftin [Cefuroxime Axetil] Anaphylaxis   Penicillins Anaphylaxis   Augmentin [Amoxicillin-Pot Clavulanate] Rash    Family History  Problem Relation Age of Onset   Pancreatitis Mother    Hypertension Father    Cancer Maternal Grandmother    Heart disease Maternal Grandfather    Cancer Paternal Grandmother     Social History   Socioeconomic History   Marital status: Single    Spouse name: Not on file   Number of children: Not on file   Years of education: Not on file   Highest education level: Not on file  Occupational History   Not on file  Tobacco Use   Smoking status: Never    Passive exposure: Yes   Smokeless tobacco: Not on file  Vaping Use   Vaping Use: Never used  Substance and Sexual Activity   Alcohol use: No   Drug use: No   Sexual activity: Not on file  Other Topics Concern   Not on file  Social History Narrative   Not on file   Social Determinants of Health   Financial Resource Strain: Not on file  Food Insecurity: Not on file  Transportation Needs: Not on file  Physical Activity: Not on file  Stress: Not on file  Social Connections: Not on file  Intimate Partner Violence: Not on file    ROS General: Denies fever, chills, night sweats, changes in weight, changes in appetite HEENT: Denies headaches, ear pain, changes in vision, rhinorrhea, sore throat CV: Denies CP, palpitations, SOB, orthopnea Pulm: Denies SOB, cough, wheezing GI: Denies abdominal pain,  nausea, vomiting, diarrhea, constipation GU: Denies dysuria, hematuria, frequency, vaginal discharge + urinary urgency, pressure Msk: Denies muscle cramps, joint pains Neuro: Denies weakness, numbness, tingling Skin: Denies rashes, bruising Psych: Denies depression, anxiety, hallucinations   BP 116/78 (BP Location: Left Arm, Patient Position: Sitting, Cuff Size: Normal)   Pulse 74   Temp 98.7 F (37.1 C) (Oral)   Wt 180 lb (81.6 kg)   SpO2 98%   BMI 32.92 kg/m    Physical Exam Gen. Pleasant, well developed, well-nourished, in NAD HEENT - [place]/AT, PERRL, EOMI, conjunctive clear, no scleral icterus, no nasal drainage, pharynx without erythema or exudate. Neck: No JVD, no thyromegaly, no carotid bruits Lungs: no use of accessory muscles, no dullness to percussion, CTAB, no wheezes, rales or rhonchi Cardiovascular: Skipped beat noted, No r/g/m, no peripheral edema Abdomen: BS present, soft, nontender,nondistended, no hepatosplenomegaly Musculoskeletal: No deformities, moves all four extremities, no cyanosis or clubbing, normal tone Neuro:  A&Ox3, CN II-XII intact, normal gait Skin:  Warm, dry, intact, no lesions  Recent Results (from the past 2160 hour(s))  POCT Rapid Strep A     Status: Abnormal   Collection Time: 11/07/21  5:50 PM  Result Value Ref Range   Streptococcus, Group A Screen (Direct) POSITIVE (A) NEGATIVE    Assessment/Plan: History of asthma -Not currently requiring medication  Urinary frequency -Acute concern - Plan: POCT urinalysis dipstick, Culture, Urine  Encounter to establish care -We reviewed the PMH, PSH, FH, SH, Meds and Allergies. -We provided refills for any medications we will prescribe as needed. -We addressed current concerns per orders and patient instructions. -We have asked for records for pertinent exams, studies, vaccines and notes from previous providers. -We have advised patient to follow up per instructions below.  Suspected UTI  -Obtain UA -Presence of RBCs likely as patient currently on menses -Obtain Ucx -Allergies to penicillin, Ceftin, Augmentin -Given strict precautions for starting Macrobid. - Plan: nitrofurantoin, macrocrystal-monohydrate, (MACROBID) 100 MG capsule, Urine Microscopic Only  F/u as needed for CPE and labs  Abbe Amsterdam, MD

## 2022-01-07 ENCOUNTER — Encounter: Payer: Self-pay | Admitting: Family Medicine

## 2022-01-08 LAB — URINE CULTURE
MICRO NUMBER:: 13767780
SPECIMEN QUALITY:: ADEQUATE

## 2022-01-10 ENCOUNTER — Other Ambulatory Visit: Payer: Self-pay | Admitting: Family Medicine

## 2022-01-10 DIAGNOSIS — R3989 Other symptoms and signs involving the genitourinary system: Secondary | ICD-10-CM

## 2022-01-10 MED ORDER — CIPROFLOXACIN HCL 500 MG PO TABS: 500.0000 mg | ORAL_TABLET | Freq: Two times a day (BID) | ORAL | 0 refills | Status: AC

## 2022-05-08 ENCOUNTER — Ambulatory Visit (HOSPITAL_COMMUNITY)
Admission: EM | Admit: 2022-05-08 | Discharge: 2022-05-08 | Disposition: A | Discharge status: Home / Self Care | Payer: Medicaid Other | Attending: Emergency Medicine | Admitting: Emergency Medicine

## 2022-05-08 ENCOUNTER — Encounter (HOSPITAL_COMMUNITY): Payer: Self-pay | Admitting: Emergency Medicine

## 2022-05-08 ENCOUNTER — Ambulatory Visit (INDEPENDENT_AMBULATORY_CARE_PROVIDER_SITE_OTHER): Payer: Medicaid Other

## 2022-05-08 DIAGNOSIS — M25572 Pain in left ankle and joints of left foot: Secondary | ICD-10-CM | POA: Diagnosis not present

## 2022-05-08 MED ORDER — KETOROLAC TROMETHAMINE 30 MG/ML IJ SOLN
INTRAMUSCULAR | Status: AC
  Filled 2022-05-08: qty 1

## 2022-05-08 MED ORDER — IBUPROFEN 800 MG PO TABS: 800.0000 mg | ORAL_TABLET | Freq: Three times a day (TID) | ORAL | 0 refills | Status: DC

## 2022-05-08 MED ORDER — KETOROLAC TROMETHAMINE 30 MG/ML IJ SOLN
30.0000 mg | Freq: Once | INTRAMUSCULAR | Status: AC
Start: 2022-05-08 — End: 2022-05-08
  Administered 2022-05-08: 30 mg via INTRAMUSCULAR

## 2022-05-08 NOTE — ED Triage Notes (Signed)
Pt c/o left ankle pain that started this morning. Denies injury or fall.

## 2022-05-08 NOTE — Discharge Instructions (Addendum)
Ibuprofen 800 mg has been sent to the pharmacy, you can begin taking this tomorrow since we gave you a Toradol injection today in office.   Rest, ice, compression, and elevation can help with symptom management.  It will help you to rest the affected area.  Use ice to help with inflammation at the site of injury.  You may use a compression bandage or sleeve to provide support for the affected area.  Providing elevation for the affected extremity when able can aid healing.   Please follow up with [facility] if symptoms are not improving.

## 2022-05-08 NOTE — ED Provider Notes (Signed)
[facility]    CSN: [id number] Arrival date & time: 05/08/22  1237      History   Chief Complaint Chief Complaint  Patient presents with   Ankle Pain    HPI Veronica Casey is a 21 y.o. female.  Patient presents complaining of left ankle pain that started this morning upon waking.  Patient denies fall or trauma.  Patient reports difficulty with ambulation due to ankle pain.  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  Patient reports a previous tendon injury on "front side" of  left ankle when she was younger, she states that this was resolved with compression and physical therapy.  Patient denies any swelling or changes to the skin of the left ankle.  Patient denies any numbness or tingling in left lower extremity.  Patient denies any previous surgery to left ankle.    Ankle Pain   Past Medical History:  Diagnosis Date   Asthma    Headache    Heart murmur    IBS (irritable bowel syndrome)    Multiple allergies    Otitis     Patient Active Problem List   Diagnosis Date Noted   History of asthma 01/04/2022    Past Surgical History:  Procedure Laterality Date   TOOTH EXTRACTION     TYMPANOSTOMY TUBE PLACEMENT      OB History     Gravida  0   Para  0   Term  0   Preterm  0   AB  0   Living  0      SAB  0   IAB  0   Ectopic  0   Multiple  0   Live Births               Home Medications    Prior to Admission medications   Medication Sig Start Date End Date Taking? Authorizing Provider  ibuprofen (ADVIL) 800 MG tablet Take 1 tablet (800 mg total) by mouth 3 (three) times daily. 05/08/22  Yes Debby Freiberg, NP  acetaminophen (TYLENOL) 500 MG tablet Take by mouth. 01/03/20   [provider]    Family History Family History  Problem Relation Age of Onset   Pancreatitis Mother    Hypertension Father     Cancer Maternal Grandmother    Heart disease Maternal Grandfather    Cancer Paternal Grandmother     Social History Social History   Tobacco Use   Smoking status: Never    Passive exposure: Yes  Vaping Use   Vaping Use: Never used  Substance Use Topics   Alcohol use: No   Drug use: No     Allergies   Ceftin [cefuroxime axetil], Penicillins, and Augmentin [amoxicillin-pot clavulanate]   Review of Systems Review of Systems Per HPI   Physical Exam Triage Vital Signs ED Triage Vitals  Enc Vitals Group     BP 05/08/22 1516 (!) 131/90     Pulse Rate 05/08/22 1516 65     Resp 05/08/22 1516 17     Temp 05/08/22 1516 98.9 F (37.2 C)     Temp Source 05/08/22 1516 Oral     SpO2 05/08/22 1516 100 %     Weight --      Height --      Head Circumference --      Peak  Flow --      Pain Score 05/08/22 1515 4     Pain Loc --      Pain Edu? --      Excl. in [place]? --    No data found.  Updated Vital Signs BP (!) 131/90 (BP Location: Right Arm)   Pulse 65   Temp 98.9 F (37.2 C) (Oral)   Resp 17   LMP 04/16/2022   SpO2 100%      Physical Exam Vitals and nursing note reviewed.  Constitutional:      Appearance: Normal appearance.  Cardiovascular:     Pulses:          Posterior tibial pulses are 2+ on the left side.  Musculoskeletal:     Left lower leg: No edema.     Right ankle: Normal.     Right Achilles Tendon: Normal. No tenderness.     Left ankle: No swelling, deformity or ecchymosis. Tenderness present over the medial malleolus. No lateral malleolus tenderness. Normal range of motion. Normal pulse.     Left Achilles Tendon: Normal. No tenderness.     Comments: LFT Ankle: Patient reports increased pain  medial malleolus with dorsiflexion.  Patient denies any pain with plantarflexion, inversion, or eversion of ankle.  LFT Ankle: Tenderness upon palpation of medial malleolus.   Skin:    Findings: No bruising, ecchymosis or erythema.     Comments: No skin  changes present upon assessment of left ankle.   Neurological:     Mental Status: She is alert.      UC Treatments / Results  Labs (all labs ordered are listed, but only abnormal results are displayed) Labs Reviewed - No data to display  EKG   Radiology DG Ankle Complete Left  Result Date: 05/08/2022 CLINICAL DATA:  Pain EXAM: LEFT ANKLE COMPLETE - 3+ VIEW COMPARISON:  None Available. FINDINGS: No recent fracture or dislocation is seen in left ankle. In the lateral view, there is 4 mm smooth marginated calcification in the dorsal aspect of navicular close to the talonavicular joint. IMPRESSION: No recent fracture or dislocation is seen. 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 Electronically Signed   By: Elmer Picker M.D.   On: 05/08/2022 17:01    Procedures Procedures (including critical care time)  Medications Ordered in UC Medications  ketorolac (TORADOL) 30 MG/ML injection 30 mg (30 mg Intramuscular Given 05/08/22 1703)    Initial Impression / Assessment and Plan / UC Course  I have reviewed the triage vital signs and the nursing notes.  Pertinent labs & imaging results that were available during my care of the patient were reviewed by me and considered in my medical decision making (see chart for details).     Patient presents for Left Ankle Pain. Left Ankle X-Ray ordered showed an old injury around talonavicular joint, no fracture, dislocation, or effusion within ankle.  Etiology of symptoms may be due to occupation and inflammation around the ligament present on the medial side of ankle.  Patient was educated on RICE protocol.  Ace bandage was placed and patient was given crutches.  Patient was made aware that she should attempt to start bearing weight on ankle and perform ankle exercises tomorrow.  Ibuprofen 800 mg was sent to the pharmacy, patient was made aware to start  taking this tomorrow since she was given a Toradol injection today.  Patient was given information for [facility] in  the case that symptoms do not improve, she was made aware that she should follow-up with this office.  She was given a work note. Patient verbalized understanding of instructions.    Charting was provided using a a verbal dictation system, charting was proofread for errors, errors may occur which could change the meaning of the information charted.   Final Clinical Impressions(s) / UC Diagnoses   Final diagnoses:  Acute left ankle pain     Discharge Instructions      Ibuprofen 800 mg has been sent to the pharmacy, you can begin taking this tomorrow since we gave you a Toradol injection today in office.   Rest, ice, compression, and elevation can help with symptom management.  It will help you to rest the affected area.  Use ice to help with inflammation at the site of injury.  You may use a compression bandage or sleeve to provide support for the affected area.  Providing elevation for the affected extremity when able can aid healing.   Please follow up with [facility] if symptoms are not improving.      ED Prescriptions     Medication Sig Dispense Auth. Provider   ibuprofen (ADVIL) 800 MG tablet Take 1 tablet (800 mg total) by mouth 3 (three) times daily. 32 tablet Flossie Dibble, NP      PDMP not reviewed this encounter.   Flossie Dibble, NP 05/08/22 1820

## 2022-08-31 ENCOUNTER — Ambulatory Visit
Admission: RE | Admit: 2022-08-31 | Discharge: 2022-08-31 | Disposition: A | Discharge status: Home / Self Care | Payer: Medicaid Other | Source: Ambulatory Visit | Attending: Nurse Practitioner | Admitting: Nurse Practitioner

## 2022-08-31 ENCOUNTER — Ambulatory Visit: Payer: Self-pay

## 2022-08-31 VITALS — BP 130/86 | HR 109 | Temp 99.0°F | Resp 18

## 2022-08-31 DIAGNOSIS — J45901 Unspecified asthma with (acute) exacerbation: Secondary | ICD-10-CM

## 2022-08-31 DIAGNOSIS — R051 Acute cough: Secondary | ICD-10-CM | POA: Diagnosis present

## 2022-08-31 DIAGNOSIS — J069 Acute upper respiratory infection, unspecified: Secondary | ICD-10-CM | POA: Insufficient documentation

## 2022-08-31 DIAGNOSIS — Z1152 Encounter for screening for COVID-19: Secondary | ICD-10-CM | POA: Insufficient documentation

## 2022-08-31 LAB — POCT INFLUENZA A/B
Influenza A, POC: NEGATIVE
Influenza B, POC: NEGATIVE

## 2022-08-31 MED ORDER — ALBUTEROL SULFATE HFA 108 (90 BASE) MCG/ACT IN AERS: 1.0000 | INHALATION_SPRAY | Freq: Four times a day (QID) | RESPIRATORY_TRACT | 0 refills | Status: DC | PRN

## 2022-08-31 MED ORDER — FLUTICASONE PROPIONATE HFA 110 MCG/ACT IN AERO: 1.0000 | INHALATION_SPRAY | Freq: Two times a day (BID) | RESPIRATORY_TRACT | 0 refills | Status: DC

## 2022-08-31 NOTE — ED Provider Notes (Signed)
[facility]    CSN: [id number] Arrival date & time: 08/31/22  1423      History   Chief Complaint Chief Complaint  Patient presents with   Back Pain    Caught a cold and symptoms are getting worse such as body aches and sometimes hard to breathe - Entered by patient   Generalized Body Aches   Nasal Congestion   Cough    HPI Veronica Casey is a 22 y.o. female  presents for evaluation of URI symptoms for 2 days. Patient reports associated symptoms of cough, congestion, body aches, chills with 1 episode of vomiting. Denies diarrhea, ear pain, sore throat, fevers. Patient does have a hx of asthma.  Is out of her inhaler.  Does endorse some wheezing.  Boyfriend has similar symptoms..  Pt has taken nothing OTC for symptoms. Pt has no other concerns at this time.    Back Pain Cough Associated symptoms: chills and myalgias     Past Medical History:  Diagnosis Date   Asthma    Headache    Heart murmur    IBS (irritable bowel syndrome)    Multiple allergies    Otitis     Patient Active Problem List   Diagnosis Date Noted   History of asthma 01/04/2022    Past Surgical History:  Procedure Laterality Date   TOOTH EXTRACTION     TYMPANOSTOMY TUBE PLACEMENT      OB History     Gravida  0   Para  0   Term  0   Preterm  0   AB  0   Living  0      SAB  0   IAB  0   Ectopic  0   Multiple  0   Live Births               Home Medications    Prior to Admission medications   Medication Sig Start Date End Date Taking? Authorizing Provider  albuterol (VENTOLIN HFA) 108 (90 Base) MCG/ACT inhaler Inhale 1-2 puffs into the lungs every 6 (six) hours as needed for wheezing or shortness of breath. 08/31/22  Yes Radford PaxMayer, Jodi R, NP  fluticasone (FLOVENT HFA) 110 MCG/ACT inhaler Inhale 1 puff into the lungs 2 (two) times daily for 7 days. 08/31/22 09/07/22 Yes Radford PaxMayer, Jodi R, NP  acetaminophen (TYLENOL) 500 MG tablet Take by mouth. 01/03/20   [provider]  ibuprofen (ADVIL) 800 MG tablet Take 1 tablet (800 mg total) by mouth 3 (three) times daily. 05/08/22   Debby FreibergWedderburn, Ngozi N, NP    Family History Family History  Problem Relation Age of Onset   Pancreatitis Mother    Hypertension Father    Cancer Maternal Grandmother    Heart disease Maternal Grandfather    Cancer Paternal Grandmother     Social History Social History   Tobacco Use   Smoking status: Never    Passive exposure: Yes  Vaping Use   Vaping Use: Never used  Substance Use Topics   Alcohol use: No   Drug use: No     Allergies   Ceftin [cefuroxime axetil], Cefuroxime, Cefuroxime axetil, Gadolinium derivatives, Penicillins, and Augmentin [amoxicillin-pot clavulanate]   Review of Systems Review of Systems  Constitutional:  Positive for chills.  HENT:  Positive for congestion.   Respiratory:  Positive for cough.   Musculoskeletal:  Positive for myalgias.     Physical Exam Triage Vital Signs ED Triage Vitals  Enc  Vitals Group     BP 08/31/22 1435 130/86     Pulse Rate 08/31/22 1435 (!) 109     Resp 08/31/22 1435 18     Temp 08/31/22 1435 99 F (37.2 C)     Temp Source 08/31/22 1435 Oral     SpO2 08/31/22 1435 97 %     Weight --      Height --      Head Circumference --      Peak Flow --      Pain Score 08/31/22 1434 10     Pain Loc --      Pain Edu? --      Excl. in [place]? --    No data found.  Updated Vital Signs BP 130/86 (BP Location: Left Arm)   Pulse (!) 109   Temp 99 F (37.2 C) (Oral)   Resp 18   LMP 08/29/2022   SpO2 97%   Visual Acuity Right Eye Distance:   Left Eye Distance:   Bilateral Distance:    Right Eye Near:   Left Eye Near:    Bilateral Near:     Physical Exam Vitals and nursing note reviewed.  Constitutional:      General: She is not in acute distress.    Appearance: She is well-developed. She is not ill-appearing.  HENT:     Head: Normocephalic and atraumatic.     Right Ear: Tympanic membrane  and ear canal normal.     Left Ear: Tympanic membrane and ear canal normal.     Nose: Congestion present.     Mouth/Throat:     Mouth: Mucous membranes are moist.     Pharynx: Oropharynx is clear. Uvula midline. No oropharyngeal exudate or posterior oropharyngeal erythema.     Tonsils: No tonsillar exudate or tonsillar abscesses.  Eyes:     Conjunctiva/sclera: Conjunctivae normal.     Pupils: Pupils are equal, round, and reactive to light.  Cardiovascular:     Rate and Rhythm: Normal rate and regular rhythm.     Heart sounds: Normal heart sounds.  Pulmonary:     Effort: Pulmonary effort is normal.     Breath sounds: Normal breath sounds.  Musculoskeletal:     Cervical back: Normal range of motion and neck supple.  Lymphadenopathy:     Cervical: No cervical adenopathy.  Skin:    General: Skin is warm and dry.  Neurological:     General: No focal deficit present.     Mental Status: She is alert and oriented to person, place, and time.  Psychiatric:        Mood and Affect: Mood normal.        Behavior: Behavior normal.      UC Treatments / Results  Labs (all labs ordered are listed, but only abnormal results are displayed) Labs Reviewed  SARS CORONAVIRUS 2 (TAT 6-24 HRS)  POCT INFLUENZA A/B    EKG   Radiology No results found.  Procedures Procedures (including critical care time)  Medications Ordered in UC Medications - No data to display  Initial Impression / Assessment and Plan / UC Course  I have reviewed the triage vital signs and the nursing notes.  Pertinent labs & imaging results that were available during my care of the patient were reviewed by me and considered in my medical decision making (see chart for details).     COVID PCR and will contact if positive Negative rapid flu Discussed viral illness and symptomatic treatment  Albuterol inhaler, start Flovent twice daily for 7 days DC cough medicine as needed PCP follow-up if symptoms do not  improve ER precautions reviewed and patient verbalized understanding Final Clinical Impressions(s) / UC Diagnoses   Final diagnoses:  Acute cough  Acute upper respiratory infection  Mild asthma with acute exacerbation, unspecified whether persistent     Discharge Instructions      Your flu test was negative.  The clinical contact you with result of your COVID testing is positive Albuterol inhaler as needed Steroid inhaler twice daily for 7 days Over-the-counter cough medicine as needed Rest and fluids PCP follow-up if your symptoms or not improving Please go to the emergency room if you have any worsening symptoms     ED Prescriptions     Medication Sig Dispense Auth. Provider   albuterol (VENTOLIN HFA) 108 (90 Base) MCG/ACT inhaler Inhale 1-2 puffs into the lungs every 6 (six) hours as needed for wheezing or shortness of breath. 1 each Radford Pax, NP   fluticasone (FLOVENT HFA) 110 MCG/ACT inhaler Inhale 1 puff into the lungs 2 (two) times daily for 7 days. 1 each Radford Pax, NP      PDMP not reviewed this encounter.   Radford Pax, NP 08/31/22 [phone]

## 2022-08-31 NOTE — ED Triage Notes (Signed)
Patient states she has been having chest pain, congestion, body aches and allergy related symptoms.   Started: Wednesday   Home interventions: hydration

## 2022-08-31 NOTE — Discharge Instructions (Signed)
Your flu test was negative.  The clinical contact you with result of your COVID testing is positive Albuterol inhaler as needed Steroid inhaler twice daily for 7 days Over-the-counter cough medicine as needed Rest and fluids PCP follow-up if your symptoms or not improving Please go to the emergency room if you have any worsening symptoms

## 2022-09-01 LAB — SARS CORONAVIRUS 2 (TAT 6-24 HRS): SARS Coronavirus 2: NEGATIVE

## 2022-10-01 ENCOUNTER — Encounter: Payer: Self-pay | Admitting: Family Medicine

## 2022-10-01 ENCOUNTER — Other Ambulatory Visit (HOSPITAL_COMMUNITY)
Admission: RE | Admit: 2022-10-01 | Discharge: 2022-10-01 | Disposition: A | Discharge status: Home / Self Care | Payer: Medicaid Other | Source: Ambulatory Visit | Attending: Family Medicine | Admitting: Family Medicine

## 2022-10-01 ENCOUNTER — Ambulatory Visit: Payer: Medicaid Other | Admitting: Family Medicine

## 2022-10-01 VITALS — BP 116/62 | HR 70 | Temp 98.7°F | Ht 61.0 in | Wt 150.4 lb

## 2022-10-01 DIAGNOSIS — Z0000 Encounter for general adult medical examination without abnormal findings: Secondary | ICD-10-CM | POA: Diagnosis not present

## 2022-10-01 DIAGNOSIS — Z124 Encounter for screening for malignant neoplasm of cervix: Secondary | ICD-10-CM

## 2022-10-01 LAB — COMPREHENSIVE METABOLIC PANEL
ALT: 10 U/L (ref 0–35)
AST: 16 U/L (ref 0–37)
Albumin: 4.6 g/dL (ref 3.5–5.2)
Alkaline Phosphatase: 51 U/L (ref 39–117)
BUN: 10 mg/dL (ref 6–23)
CO2: 28 mEq/L (ref 19–32)
Calcium: 9.6 mg/dL (ref 8.4–10.5)
Chloride: 105 mEq/L (ref 96–112)
Creatinine, Ser: 0.82 mg/dL (ref 0.40–1.20)
GFR: 100.35 mL/min (ref >60.00)
Glucose, Bld: 86 mg/dL (ref 70–99)
Potassium: 4 mEq/L (ref 3.5–5.1)
Sodium: 141 mEq/L (ref 135–145)
Total Bilirubin: 0.7 mg/dL (ref 0.2–1.2)
Total Protein: 7.5 g/dL (ref 6.0–8.3)

## 2022-10-01 LAB — LIPID PANEL
Cholesterol: 127 mg/dL (ref 0–200)
HDL: 46.8 mg/dL (ref >39.00)
LDL Cholesterol: 72 mg/dL (ref 0–99)
NonHDL: 79.78
Total CHOL/HDL Ratio: 3
Triglycerides: 41 mg/dL (ref 0.0–149.0)
VLDL: 8.2 mg/dL (ref 0.0–40.0)

## 2022-10-01 LAB — CBC WITH DIFFERENTIAL/PLATELET
Basophils Absolute: 0 10*3/uL (ref 0.0–0.1)
Basophils Relative: 0.5 % (ref 0.0–3.0)
Eosinophils Absolute: 0.1 10*3/uL (ref 0.0–0.7)
Eosinophils Relative: 1.4 % (ref 0.0–5.0)
HCT: 38.9 % (ref 36.0–46.0)
Hemoglobin: 13.3 g/dL (ref 12.0–15.0)
Lymphocytes Relative: 34.3 % (ref 12.0–46.0)
Lymphs Abs: 1.7 10*3/uL (ref 0.7–4.0)
MCHC: 34.3 g/dL (ref 30.0–36.0)
MCV: 88.3 fl (ref 78.0–100.0)
Monocytes Absolute: 0.4 10*3/uL (ref 0.1–1.0)
Monocytes Relative: 8.2 % (ref 3.0–12.0)
Neutro Abs: 2.7 10*3/uL (ref 1.4–7.7)
Neutrophils Relative %: 55.6 % (ref 43.0–77.0)
Platelets: 304 10*3/uL (ref 150.0–400.0)
RBC: 4.4 Mil/uL (ref 3.87–5.11)
RDW: 13.5 % (ref 11.5–15.5)
WBC: 4.9 10*3/uL (ref 4.0–10.5)

## 2022-10-01 LAB — T4, FREE: Free T4: 0.89 ng/dL (ref 0.60–1.60)

## 2022-10-01 LAB — HEMOGLOBIN A1C: Hgb A1c MFr Bld: 5.2 % (ref 4.6–6.5)

## 2022-10-01 LAB — TSH: TSH: 1.11 u[IU]/mL (ref 0.35–5.50)

## 2022-10-01 NOTE — Progress Notes (Signed)
Established Patient Office Visit   Subjective  Patient ID: Veronica Casey, female    DOB: September 18, 2000  Age: 22 y.o. MRN: [id number]  Chief Complaint  Patient presents with   Gynecologic Exam    And labs    Patient 22 year old female who presents for CPE.  Patient states she is doing well overall and is without complaint.  Endorses clear to clearish white discharge prior to menses and afterwards.  Denies irritation.  Gynecologic Exam    Past Medical History:  Diagnosis Date   Asthma    Headache    Heart murmur    IBS (irritable bowel syndrome)    Multiple allergies    Otitis    Past Surgical History:  Procedure Laterality Date   TOOTH EXTRACTION     TYMPANOSTOMY TUBE PLACEMENT     Social History   Tobacco Use   Smoking status: Never    Passive exposure: Yes  Vaping Use   Vaping Use: Never used  Substance Use Topics   Alcohol use: No   Drug use: No   Family History  Problem Relation Age of Onset   Pancreatitis Mother    Hypertension Father    Cancer Maternal Grandmother    Heart disease Maternal Grandfather    Cancer Paternal Grandmother    Allergies  Allergen Reactions   Ceftin [Cefuroxime Axetil] Anaphylaxis   Cefuroxime Anaphylaxis   Cefuroxime Axetil Anaphylaxis   Gadolinium Derivatives Anaphylaxis    Vomiting, diaphoresis, throat swelling, and wheezing after contrast injection. RRT event.Treated with benadryl IV and Epi and taken to ED. Patient will need steroid prep for any future MRI with gadolinium.   Vomiting, diaphoresis, throat swelling, and wheezing after contrast injection. RRT event.Treated with benadryl IV and Epi and taken to ED. Patient will need steroid prep for any future MRI with gadolinium.   Penicillins Anaphylaxis   Augmentin [Amoxicillin-Pot Clavulanate] Rash      ROS Negative unless stated above    Objective:     BP 116/62 (BP Location: Right Arm, Patient Position: Sitting, Cuff Size: Normal)   Pulse 70   Temp  98.7 F (37.1 C) (Oral)   Ht 5\' 1"  (1.549 m)   Wt 150 lb 6.4 oz (68.2 kg)   LMP 09/25/2022 (Approximate)   SpO2 98%   BMI 28.42 kg/m    Physical Exam Constitutional:      Appearance: Normal appearance.  HENT:     Head: Normocephalic and atraumatic.     Right Ear: Tympanic membrane, ear canal and external ear normal.     Left Ear: Tympanic membrane, ear canal and external ear normal.     Nose: Nose normal.     Mouth/Throat:     Mouth: Mucous membranes are moist.     Pharynx: No oropharyngeal exudate or posterior oropharyngeal erythema.  Eyes:     General: No scleral icterus.    Extraocular Movements: Extraocular movements intact.     Conjunctiva/sclera: Conjunctivae normal.     Pupils: Pupils are equal, round, and reactive to light.  Neck:     Thyroid: No thyromegaly.  Cardiovascular:     Rate and Rhythm: Normal rate and regular rhythm.     Pulses: Normal pulses.     Heart sounds: Normal heart sounds. No murmur heard.    No friction rub.  Pulmonary:     Effort: Pulmonary effort is normal.     Breath sounds: Normal breath sounds. No wheezing, rhonchi or rales.  Abdominal:  General: Bowel sounds are normal.     Palpations: Abdomen is soft.     Tenderness: There is no abdominal tenderness.  Genitourinary:    Urethra: No urethral swelling.     Vagina: Normal. No vaginal discharge, erythema, tenderness or lesions.     Cervix: No cervical motion tenderness, lesion or erythema.     Uterus: Normal.      Rectum: Normal. External hemorrhoid: cmp.     Comments: Carbuncles noted on b/l labial majora.   Musculoskeletal:        General: No deformity. Normal range of motion.  Lymphadenopathy:     Cervical: No cervical adenopathy.  Skin:    General: Skin is warm and dry.     Findings: No lesion.  Neurological:     General: No focal deficit present.     Mental Status: She is alert and oriented to person, place, and time.  Psychiatric:        Mood and Affect: Mood normal.         Thought Content: Thought content normal.      No results found for any visits on 10/01/22.    Assessment & Plan:  Well adult exam -Age-appropriate screenings discussed -Pap done this visit -Immunizations reviewed -Next CPE in 1 year -     CBC with Differential/Platelet -     TSH -     T4, free -     Hemoglobin A1c -     Lipid panel -     Comprehensive metabolic panel  Screening for cervical cancer -     Cytology - PAP   Return if symptoms worsen or fail to improve.   Deeann Saint, MD

## 2022-10-05 LAB — CYTOLOGY - PAP
Chlamydia: NEGATIVE
Comment: NEGATIVE
Comment: NEGATIVE
Comment: NEGATIVE
Comment: NORMAL
Diagnosis: UNDETERMINED — AB
High risk HPV: POSITIVE — AB
Neisseria Gonorrhea: NEGATIVE
Trichomonas: NEGATIVE

## 2022-10-08 ENCOUNTER — Encounter: Payer: Self-pay | Admitting: Family Medicine

## 2023-03-06 ENCOUNTER — Emergency Department (HOSPITAL_COMMUNITY): Payer: No Typology Code available for payment source

## 2023-03-06 ENCOUNTER — Emergency Department (HOSPITAL_COMMUNITY)
Admission: EM | Admit: 2023-03-06 | Discharge: 2023-03-06 | Disposition: A | Discharge status: Home / Self Care | Payer: No Typology Code available for payment source | Attending: Emergency Medicine | Admitting: Emergency Medicine

## 2023-03-06 ENCOUNTER — Encounter (HOSPITAL_COMMUNITY): Payer: Self-pay

## 2023-03-06 ENCOUNTER — Other Ambulatory Visit: Payer: Self-pay

## 2023-03-06 DIAGNOSIS — S52612A Displaced fracture of left ulna styloid process, initial encounter for closed fracture: Secondary | ICD-10-CM | POA: Diagnosis not present

## 2023-03-06 DIAGNOSIS — G4489 Other headache syndrome: Secondary | ICD-10-CM | POA: Diagnosis not present

## 2023-03-06 DIAGNOSIS — Y92410 Unspecified street and highway as the place of occurrence of the external cause: Secondary | ICD-10-CM | POA: Insufficient documentation

## 2023-03-06 DIAGNOSIS — M25532 Pain in left wrist: Secondary | ICD-10-CM | POA: Diagnosis present

## 2023-03-06 DIAGNOSIS — I10 Essential (primary) hypertension: Secondary | ICD-10-CM | POA: Diagnosis not present

## 2023-03-06 DIAGNOSIS — R000 Tachycardia, unspecified: Secondary | ICD-10-CM | POA: Diagnosis not present

## 2023-03-06 DIAGNOSIS — R0789 Other chest pain: Secondary | ICD-10-CM | POA: Diagnosis not present

## 2023-03-06 DIAGNOSIS — R0689 Other abnormalities of breathing: Secondary | ICD-10-CM | POA: Diagnosis not present

## 2023-03-06 LAB — CBC
HCT: 41.3 % (ref 36.0–46.0)
Hemoglobin: 14.2 g/dL (ref 12.0–15.0)
MCH: 30.8 pg (ref 26.0–34.0)
MCHC: 34.4 g/dL (ref 30.0–36.0)
MCV: 89.6 fL (ref 80.0–100.0)
Platelets: 287 10*3/uL (ref 150–400)
RBC: 4.61 MIL/uL (ref 3.87–5.11)
RDW: 12.4 % (ref 11.5–15.5)
WBC: 5.7 10*3/uL (ref 4.0–10.5)
nRBC: 0 % (ref 0.0–0.2)

## 2023-03-06 LAB — TROPONIN I (HIGH SENSITIVITY): Troponin I (High Sensitivity): <2 ng/L (ref <18)

## 2023-03-06 LAB — BASIC METABOLIC PANEL
Anion gap: 7 (ref 5–15)
BUN: 12 mg/dL (ref 6–20)
CO2: 28 mmol/L (ref 22–32)
Calcium: 9.7 mg/dL (ref 8.9–10.3)
Chloride: 104 mmol/L (ref 98–111)
Creatinine, Ser: 0.79 mg/dL (ref 0.44–1.00)
GFR, Estimated: >60 mL/min (ref >60)
Glucose, Bld: 99 mg/dL (ref 70–99)
Potassium: 3.5 mmol/L (ref 3.5–5.1)
Sodium: 139 mmol/L (ref 135–145)

## 2023-03-06 LAB — HCG, SERUM, QUALITATIVE: Preg, Serum: NEGATIVE

## 2023-03-06 MED ORDER — HYDROCODONE-ACETAMINOPHEN 5-325 MG PO TABS
1.0000 | ORAL_TABLET | Freq: Four times a day (QID) | ORAL | 0 refills | Status: AC | PRN
Start: 2023-03-06 — End: ?

## 2023-03-06 MED ORDER — KETOROLAC TROMETHAMINE 10 MG PO TABS: 10.0000 mg | ORAL_TABLET | Freq: Four times a day (QID) | ORAL | 0 refills | Status: AC | PRN

## 2023-03-06 MED ORDER — KETOROLAC TROMETHAMINE 10 MG PO TABS: 10.0000 mg | ORAL_TABLET | Freq: Four times a day (QID) | ORAL | 0 refills | Status: DC | PRN

## 2023-03-06 MED ORDER — HYDROCODONE-ACETAMINOPHEN 5-325 MG PO TABS
1.0000 | ORAL_TABLET | Freq: Four times a day (QID) | ORAL | 0 refills | Status: DC | PRN
Start: 2023-03-06 — End: 2023-03-06

## 2023-03-06 MED ORDER — FENTANYL CITRATE PF 50 MCG/ML IJ SOSY
50.0000 ug | PREFILLED_SYRINGE | Freq: Once | INTRAMUSCULAR | Status: AC
  Administered 2023-03-06: 50 ug via INTRAVENOUS
  Filled 2023-03-06: qty 1

## 2023-03-06 MED ORDER — ACETAMINOPHEN 500 MG PO TABS
1000.0000 mg | ORAL_TABLET | Freq: Once | ORAL | Status: AC
  Administered 2023-03-06: 1000 mg via ORAL
  Filled 2023-03-06: qty 2
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
## 2023-03-06 NOTE — Progress Notes (Signed)
Orthopedic Tech Progress Note Patient Details:  Veronica Casey 10-Dec-2000 [id number]  Ortho Devices Type of Ortho Device: Volar splint Ortho Device/Splint Location: left volar applied Ortho Device/Splint Interventions: Ordered, Application, Adjustment   Post Interventions Patient Tolerated: Well Instructions Provided: Adjustment of device, Care of device  Kizzie Fantasia 03/06/2023, 7:54 PM
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
## 2023-03-13 ENCOUNTER — Emergency Department (HOSPITAL_COMMUNITY): Payer: No Typology Code available for payment source

## 2023-03-13 ENCOUNTER — Emergency Department (HOSPITAL_COMMUNITY)
Admission: EM | Admit: 2023-03-13 | Discharge: 2023-03-13 | Disposition: A | Discharge status: Home / Self Care | Payer: No Typology Code available for payment source | Attending: Emergency Medicine | Admitting: Emergency Medicine

## 2023-03-13 ENCOUNTER — Encounter (HOSPITAL_COMMUNITY): Payer: Self-pay | Admitting: *Deleted

## 2023-03-13 ENCOUNTER — Other Ambulatory Visit: Payer: Self-pay

## 2023-03-13 DIAGNOSIS — R0789 Other chest pain: Secondary | ICD-10-CM | POA: Insufficient documentation

## 2023-03-13 DIAGNOSIS — K921 Melena: Secondary | ICD-10-CM

## 2023-03-13 DIAGNOSIS — R195 Other fecal abnormalities: Secondary | ICD-10-CM | POA: Insufficient documentation

## 2023-03-13 LAB — COMPREHENSIVE METABOLIC PANEL
ALT: 11 U/L (ref 0–44)
AST: 18 U/L (ref 15–41)
Albumin: 5.3 g/dL — ABNORMAL HIGH (ref 3.5–5.0)
Alkaline Phosphatase: 53 U/L (ref 38–126)
Anion gap: 9 (ref 5–15)
BUN: 14 mg/dL (ref 6–20)
CO2: 27 mmol/L (ref 22–32)
Calcium: 9.6 mg/dL (ref 8.9–10.3)
Chloride: 104 mmol/L (ref 98–111)
Creatinine, Ser: 0.75 mg/dL (ref 0.44–1.00)
GFR, Estimated: >60 mL/min (ref >60)
Glucose, Bld: 86 mg/dL (ref 70–99)
Potassium: 3.5 mmol/L (ref 3.5–5.1)
Sodium: 140 mmol/L (ref 135–145)
Total Bilirubin: 1.2 mg/dL (ref 0.3–1.2)
Total Protein: 8.1 g/dL (ref 6.5–8.1)

## 2023-03-13 LAB — CBC WITH DIFFERENTIAL/PLATELET
Abs Immature Granulocytes: 0.01 10*3/uL (ref 0.00–0.07)
Basophils Absolute: 0 10*3/uL (ref 0.0–0.1)
Basophils Relative: 1 %
Eosinophils Absolute: 0.1 10*3/uL (ref 0.0–0.5)
Eosinophils Relative: 1 %
HCT: 41.1 % (ref 36.0–46.0)
Hemoglobin: 14 g/dL (ref 12.0–15.0)
Immature Granulocytes: 0 %
Lymphocytes Relative: 32 %
Lymphs Abs: 1.9 10*3/uL (ref 0.7–4.0)
MCH: 31 pg (ref 26.0–34.0)
MCHC: 34.1 g/dL (ref 30.0–36.0)
MCV: 90.9 fL (ref 80.0–100.0)
Monocytes Absolute: 0.3 10*3/uL (ref 0.1–1.0)
Monocytes Relative: 5 %
Neutro Abs: 3.6 10*3/uL (ref 1.7–7.7)
Neutrophils Relative %: 61 %
Platelets: 305 10*3/uL (ref 150–400)
RBC: 4.52 MIL/uL (ref 3.87–5.11)
RDW: 12.4 % (ref 11.5–15.5)
WBC: 5.9 10*3/uL (ref 4.0–10.5)
nRBC: 0 % (ref 0.0–0.2)

## 2023-03-13 LAB — HCG, SERUM, QUALITATIVE: Preg, Serum: NEGATIVE

## 2023-03-13 LAB — TROPONIN I (HIGH SENSITIVITY): Troponin I (High Sensitivity): <2 ng/L (ref <18)

## 2023-03-13 MED ORDER — LIDOCAINE 5 % EX PTCH
1.0000 | MEDICATED_PATCH | Freq: Once | CUTANEOUS | Status: DC
  Administered 2023-03-13: 1 via TRANSDERMAL
  Filled 2023-03-13: qty 1

## 2023-03-13 MED ORDER — LIDOCAINE 5 % EX PTCH: 1.0000 | MEDICATED_PATCH | CUTANEOUS | 0 refills | Status: AC
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
## 2023-03-15 ENCOUNTER — Encounter: Payer: Self-pay | Admitting: Gastroenterology

## 2023-03-15 ENCOUNTER — Ambulatory Visit: Payer: Medicaid Other | Admitting: Family Medicine

## 2023-03-15 DIAGNOSIS — M940 Chondrocostal junction syndrome [Tietze]: Secondary | ICD-10-CM

## 2023-03-15 DIAGNOSIS — S62102D Fracture of unspecified carpal bone, left wrist, subsequent encounter for fracture with routine healing: Secondary | ICD-10-CM

## 2023-03-15 DIAGNOSIS — R0789 Other chest pain: Secondary | ICD-10-CM | POA: Diagnosis not present

## 2023-03-15 DIAGNOSIS — K921 Melena: Secondary | ICD-10-CM | POA: Diagnosis not present

## 2023-03-15 LAB — CBC WITH DIFFERENTIAL/PLATELET
Basophils Absolute: 0 10*3/uL (ref 0.0–0.1)
Basophils Relative: 0.4 % (ref 0.0–3.0)
Eosinophils Absolute: 0.1 10*3/uL (ref 0.0–0.7)
Eosinophils Relative: 2.8 % (ref 0.0–5.0)
HCT: 42.2 % (ref 36.0–46.0)
Hemoglobin: 13.7 g/dL (ref 12.0–15.0)
Lymphocytes Relative: 34.4 % (ref 12.0–46.0)
Lymphs Abs: 1.6 10*3/uL (ref 0.7–4.0)
MCHC: 32.5 g/dL (ref 30.0–36.0)
MCV: 91.1 fL (ref 78.0–100.0)
Monocytes Absolute: 0.3 10*3/uL (ref 0.1–1.0)
Monocytes Relative: 6.9 % (ref 3.0–12.0)
Neutro Abs: 2.6 10*3/uL (ref 1.4–7.7)
Neutrophils Relative %: 55.5 % (ref 43.0–77.0)
Platelets: 300 10*3/uL (ref 150.0–400.0)
RBC: 4.64 Mil/uL (ref 3.87–5.11)
RDW: 13.3 % (ref 11.5–15.5)
WBC: 4.7 10*3/uL (ref 4.0–10.5)
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
## 2023-03-16 LAB — D-DIMER, QUANTITATIVE: D-Dimer, Quant: 0.31 ug{FEU}/mL (ref <0.50)

## 2023-06-03 DIAGNOSIS — M25532 Pain in left wrist: Secondary | ICD-10-CM | POA: Diagnosis not present

## 2023-06-05 DIAGNOSIS — F4322 Adjustment disorder with anxiety: Secondary | ICD-10-CM | POA: Diagnosis not present

## 2023-06-06 ENCOUNTER — Encounter: Payer: Self-pay | Admitting: Family Medicine

## 2023-06-06 ENCOUNTER — Ambulatory Visit (INDEPENDENT_AMBULATORY_CARE_PROVIDER_SITE_OTHER): Payer: Medicaid Other | Admitting: Family Medicine

## 2023-06-06 ENCOUNTER — Other Ambulatory Visit (HOSPITAL_COMMUNITY)
Admission: RE | Admit: 2023-06-06 | Discharge: 2023-06-06 | Disposition: A | Discharge status: Home / Self Care | Payer: Medicaid Other | Source: Ambulatory Visit | Attending: Family Medicine | Admitting: Family Medicine

## 2023-06-06 VITALS — BP 136/80 | HR 97 | Temp 98.6°F | Ht 61.42 in | Wt 159.6 lb

## 2023-06-06 DIAGNOSIS — Z0000 Encounter for general adult medical examination without abnormal findings: Secondary | ICD-10-CM

## 2023-06-06 DIAGNOSIS — R87610 Atypical squamous cells of undetermined significance on cytologic smear of cervix (ASC-US): Secondary | ICD-10-CM

## 2023-06-06 DIAGNOSIS — N898 Other specified noninflammatory disorders of vagina: Secondary | ICD-10-CM | POA: Diagnosis not present

## 2023-06-06 DIAGNOSIS — R87810 Cervical high risk human papillomavirus (HPV) DNA test positive: Secondary | ICD-10-CM | POA: Diagnosis not present

## 2023-06-06 DIAGNOSIS — Z23 Encounter for immunization: Secondary | ICD-10-CM

## 2023-06-06 DIAGNOSIS — Z0184 Encounter for antibody response examination: Secondary | ICD-10-CM

## 2023-06-06 DIAGNOSIS — Z111 Encounter for screening for respiratory tuberculosis: Secondary | ICD-10-CM | POA: Diagnosis not present

## 2023-06-06 DIAGNOSIS — Z124 Encounter for screening for malignant neoplasm of cervix: Secondary | ICD-10-CM | POA: Insufficient documentation

## 2023-06-06 NOTE — Progress Notes (Signed)
 Established Patient Office Visit   Subjective  Patient ID: Veronica Casey, female    DOB: [birth year]  Age: 23 y.o. MRN: [id number]  No chief complaint on file.   Patient is a 23 year old female seen for CPE.  Patient starting a phlebotomy program.  Needs vaccines/titers for hep B, MMR, varicella, and TB testing.  Pt also here for repeat Pap due to high risk HPV and ASCUS on Pap from 10/05/2022.    Patient Active Problem List   Diagnosis Date Noted   History of asthma 01/04/2022   Past Medical History:  Diagnosis Date   Asthma    Headache    Heart murmur    IBS (irritable bowel syndrome)    Multiple allergies    Otitis    Past Surgical History:  Procedure Laterality Date   TOOTH EXTRACTION     TYMPANOSTOMY TUBE PLACEMENT     Social History   Tobacco Use   Smoking status: Never    Passive exposure: Yes  Vaping Use   Vaping status: Never Used  Substance Use Topics   Alcohol use: No   Drug use: No   Family History  Problem Relation Age of Onset   Pancreatitis Mother    Hypertension Father    Cancer Maternal Grandmother    Heart disease Maternal Grandfather    Cancer Paternal Grandmother    Allergies  Allergen Reactions   Ceftin [Cefuroxime Axetil] Anaphylaxis   Cefuroxime Anaphylaxis   Cefuroxime Axetil Anaphylaxis   Gadolinium Derivatives Anaphylaxis    Vomiting, diaphoresis, throat swelling, and wheezing after contrast injection. RRT event.Treated with benadryl IV and Epi and taken to ED. Patient will need steroid prep for any future MRI with gadolinium.   Vomiting, diaphoresis, throat swelling, and wheezing after contrast injection. RRT event.Treated with benadryl IV and Epi and taken to ED. Patient will need steroid prep for any future MRI with gadolinium.   Ivp Dye [Iodinated Contrast Media] Anaphylaxis   Penicillins Anaphylaxis   Augmentin [Amoxicillin-Pot Clavulanate] Rash      ROS Negative unless stated above    Objective:     There  were no vitals taken for this visit. BP Readings from Last 3 Encounters:  03/15/23 106/76  03/13/23 139/85  03/06/23 (!) 137/96   Wt Readings from Last 3 Encounters:  03/15/23 148 lb 3.2 oz (67.2 kg)  03/13/23 140 lb (63.5 kg)  03/06/23 140 lb (63.5 kg)      Physical Exam Constitutional:      Appearance: Normal appearance.  HENT:     Head: Normocephalic and atraumatic.     Right Ear: Tympanic membrane, ear canal and external ear normal.     Left Ear: Tympanic membrane, ear canal and external ear normal.     Nose: Nose normal.     Mouth/Throat:     Mouth: Mucous membranes are moist.     Pharynx: No oropharyngeal exudate or posterior oropharyngeal erythema.  Eyes:     General: No scleral icterus.    Extraocular Movements: Extraocular movements intact.     Conjunctiva/sclera: Conjunctivae normal.     Pupils: Pupils are equal, round, and reactive to light.  Neck:     Thyroid : No thyromegaly.  Cardiovascular:     Rate and Rhythm: Normal rate and regular rhythm.     Pulses: Normal pulses.     Heart sounds: Normal heart sounds. No murmur heard.    No friction rub.  Pulmonary:     Effort: Pulmonary effort is  normal.     Breath sounds: Normal breath sounds. No wheezing, rhonchi or rales.  Abdominal:     General: Bowel sounds are normal.     Palpations: Abdomen is soft.     Tenderness: There is no abdominal tenderness.  Genitourinary:    Pubic Area: No rash.      Labia:        Right: No rash or lesion.        Left: No rash or lesion.      Vagina: Vaginal discharge and tenderness present.     Cervix: Cervical motion tenderness, discharge and erythema present.     Uterus: Normal.      Adnexa: Right adnexa normal and left adnexa normal.     Rectum: Normal.  Musculoskeletal:        General: No deformity. Normal range of motion.  Lymphadenopathy:     Cervical: No cervical adenopathy.  Skin:    General: Skin is warm and dry.     Findings: No lesion.  Neurological:      General: No focal deficit present.     Mental Status: She is alert and oriented to person, place, and time.  Psychiatric:        Mood and Affect: Mood normal.        Thought Content: Thought content normal.      No results found for any visits on 06/06/23.    Assessment & Plan:  Well adult exam -Age-appropriate health screenings discussed -Obtain labs -Immunizations reviewed.  Influenza vaccine given this visit -Pap done this visit -Next CPE in 1 year -     CBC with Differential/Platelet; Future -     Comprehensive metabolic panel; Future -     Hemoglobin A1c; Future -     TSH; Future -     T4, free; Future  Screening for cervical cancer -     Cytology - PAP  Vaginal discharge       -     Cytology - PAP  ASCUS with positive high risk HPV cervical -Noted on Pap 10/05/2022 -Repeat Pap obtained this visit -     Cytology - PAP  Immunity status testing -     Measles/Mumps/Rubella Immunity -     Varicella zoster antibody, IgG -     Hepatitis B surface antibody,quantitative  Need for influenza vaccination -     Flu vaccine trivalent PF, 6mos and older(Flulaval,Afluria,Fluarix,Fluzone)  Visit for TB test -     QuantiFERON-TB Gold Plus    Return in about 1 year (around 06/05/2024) for physical.   Clotilda JONELLE Single, MD

## 2023-06-07 LAB — COMPREHENSIVE METABOLIC PANEL
ALT: 20 U/L (ref 0–35)
AST: 20 U/L (ref 0–37)
Albumin: 5.1 g/dL (ref 3.5–5.2)
Alkaline Phosphatase: 60 U/L (ref 39–117)
BUN: 14 mg/dL (ref 6–23)
CO2: 28 meq/L (ref 19–32)
Calcium: 9.4 mg/dL (ref 8.4–10.5)
Chloride: 103 meq/L (ref 96–112)
Creatinine, Ser: 0.83 mg/dL (ref 0.40–1.20)
GFR: 98.43 mL/min (ref >60.00)
Glucose, Bld: 60 mg/dL — ABNORMAL LOW (ref 70–99)
Potassium: 3.6 meq/L (ref 3.5–5.1)
Sodium: 141 meq/L (ref 135–145)
Total Bilirubin: 0.9 mg/dL (ref 0.2–1.2)
Total Protein: 7.9 g/dL (ref 6.0–8.3)

## 2023-06-07 LAB — TSH: TSH: 1.1 u[IU]/mL (ref 0.35–5.50)

## 2023-06-07 LAB — CBC WITH DIFFERENTIAL/PLATELET
Basophils Absolute: 0 10*3/uL (ref 0.0–0.1)
Basophils Relative: 0.5 % (ref 0.0–3.0)
Eosinophils Absolute: 0.2 10*3/uL (ref 0.0–0.7)
Eosinophils Relative: 3.1 % (ref 0.0–5.0)
HCT: 42.8 % (ref 36.0–46.0)
Hemoglobin: 14 g/dL (ref 12.0–15.0)
Lymphocytes Relative: 18.4 % (ref 12.0–46.0)
Lymphs Abs: 1.1 10*3/uL (ref 0.7–4.0)
MCHC: 32.7 g/dL (ref 30.0–36.0)
MCV: 91.3 fL (ref 78.0–100.0)
Monocytes Absolute: 0.6 10*3/uL (ref 0.1–1.0)
Monocytes Relative: 9 % (ref 3.0–12.0)
Neutro Abs: 4.2 10*3/uL (ref 1.4–7.7)
Neutrophils Relative %: 69 % (ref 43.0–77.0)
Platelets: 273 10*3/uL (ref 150.0–400.0)
RBC: 4.69 Mil/uL (ref 3.87–5.11)
RDW: 13 % (ref 11.5–15.5)
WBC: 6.1 10*3/uL (ref 4.0–10.5)

## 2023-06-07 LAB — T4, FREE: Free T4: 0.82 ng/dL (ref 0.60–1.60)

## 2023-06-07 LAB — HEMOGLOBIN A1C: Hgb A1c MFr Bld: 5.2 % (ref 4.6–6.5)

## 2023-06-10 ENCOUNTER — Encounter: Payer: Medicaid Other | Admitting: Family Medicine

## 2023-06-10 DIAGNOSIS — M545 Low back pain, unspecified: Secondary | ICD-10-CM | POA: Diagnosis not present

## 2023-06-10 DIAGNOSIS — R000 Tachycardia, unspecified: Secondary | ICD-10-CM | POA: Diagnosis not present

## 2023-06-10 DIAGNOSIS — R55 Syncope and collapse: Secondary | ICD-10-CM | POA: Diagnosis not present

## 2023-06-10 DIAGNOSIS — Z5321 Procedure and treatment not carried out due to patient leaving prior to being seen by health care provider: Secondary | ICD-10-CM | POA: Diagnosis not present

## 2023-06-11 LAB — QUANTIFERON-TB GOLD PLUS
Mitogen-NIL: 6.64 [IU]/mL
NIL: 0.02 [IU]/mL
QuantiFERON-TB Gold Plus: NEGATIVE
TB1-NIL: 0 [IU]/mL
TB2-NIL: 0 [IU]/mL

## 2023-06-11 LAB — MEASLES/MUMPS/RUBELLA IMMUNITY
Mumps IgG: 67.4 [AU]/ml
Rubella: 4.48 {index}
Rubeola IgG: >300 [AU]/ml

## 2023-06-11 LAB — HEPATITIS B SURFACE ANTIBODY, QUANTITATIVE: Hep B S AB Quant (Post): 19 m[IU]/mL (ref >=10)

## 2023-06-11 LAB — VARICELLA ZOSTER ANTIBODY, IGG: Varicella IgG: 6.56 {s_co_ratio}

## 2023-06-12 DIAGNOSIS — F4322 Adjustment disorder with anxiety: Secondary | ICD-10-CM | POA: Diagnosis not present

## 2023-06-12 LAB — CYTOLOGY - PAP
Adequacy: ABSENT
Chlamydia: NEGATIVE
Comment: NEGATIVE
Comment: NEGATIVE
Comment: NEGATIVE
Comment: NORMAL
Diagnosis: NEGATIVE
High risk HPV: NEGATIVE
Neisseria Gonorrhea: NEGATIVE
Trichomonas: NEGATIVE

## 2023-06-13 ENCOUNTER — Other Ambulatory Visit: Payer: Self-pay | Admitting: Family Medicine

## 2023-06-13 ENCOUNTER — Encounter: Payer: Self-pay | Admitting: Family Medicine

## 2023-06-13 ENCOUNTER — Telehealth: Payer: Self-pay | Admitting: Family Medicine

## 2023-06-13 DIAGNOSIS — B3731 Acute candidiasis of vulva and vagina: Secondary | ICD-10-CM

## 2023-06-13 MED ORDER — FLUCONAZOLE 150 MG PO TABS: 150.0000 mg | ORAL_TABLET | Freq: Once | ORAL | 0 refills | Status: AC

## 2023-06-13 NOTE — Telephone Encounter (Signed)
Copied from CRM [id number]. Topic: General - Other >> Jun 13, 2023  3:25 PM Pascal Lux wrote: Reason for CRM: Patient called stated she received a call from [place] and was returning the call. Requested a call back if possible.

## 2023-06-17 DIAGNOSIS — Z0279 Encounter for issue of other medical certificate: Secondary | ICD-10-CM

## 2023-06-18 ENCOUNTER — Ambulatory Visit: Payer: Medicaid Other

## 2023-06-18 NOTE — Progress Notes (Signed)
Patient came in today for a hearing and vision check for a form for work

## 2023-06-19 ENCOUNTER — Ambulatory Visit: Payer: Medicaid Other | Admitting: Gastroenterology

## 2023-06-26 DIAGNOSIS — F4322 Adjustment disorder with anxiety: Secondary | ICD-10-CM | POA: Diagnosis not present

## 2023-06-28 ENCOUNTER — Encounter: Payer: Self-pay | Admitting: Family Medicine

## 2023-07-10 DIAGNOSIS — F4322 Adjustment disorder with anxiety: Secondary | ICD-10-CM | POA: Diagnosis not present

## 2023-07-24 DIAGNOSIS — F4322 Adjustment disorder with anxiety: Secondary | ICD-10-CM | POA: Diagnosis not present

## 2023-08-07 DIAGNOSIS — F4322 Adjustment disorder with anxiety: Secondary | ICD-10-CM | POA: Diagnosis not present

## 2023-08-10 IMAGING — DX DG CHEST 2V
2 series · 2 of 2 positions shown · non-contrast
Comparison: 09/20/2021

CLINICAL DATA: Suspected Sepsis

Shortness of breath due to allergy reaction
Left shoulder rash
With RG
Dizziness
EXAM:
CHEST - 2 VIEW

[chest pa]
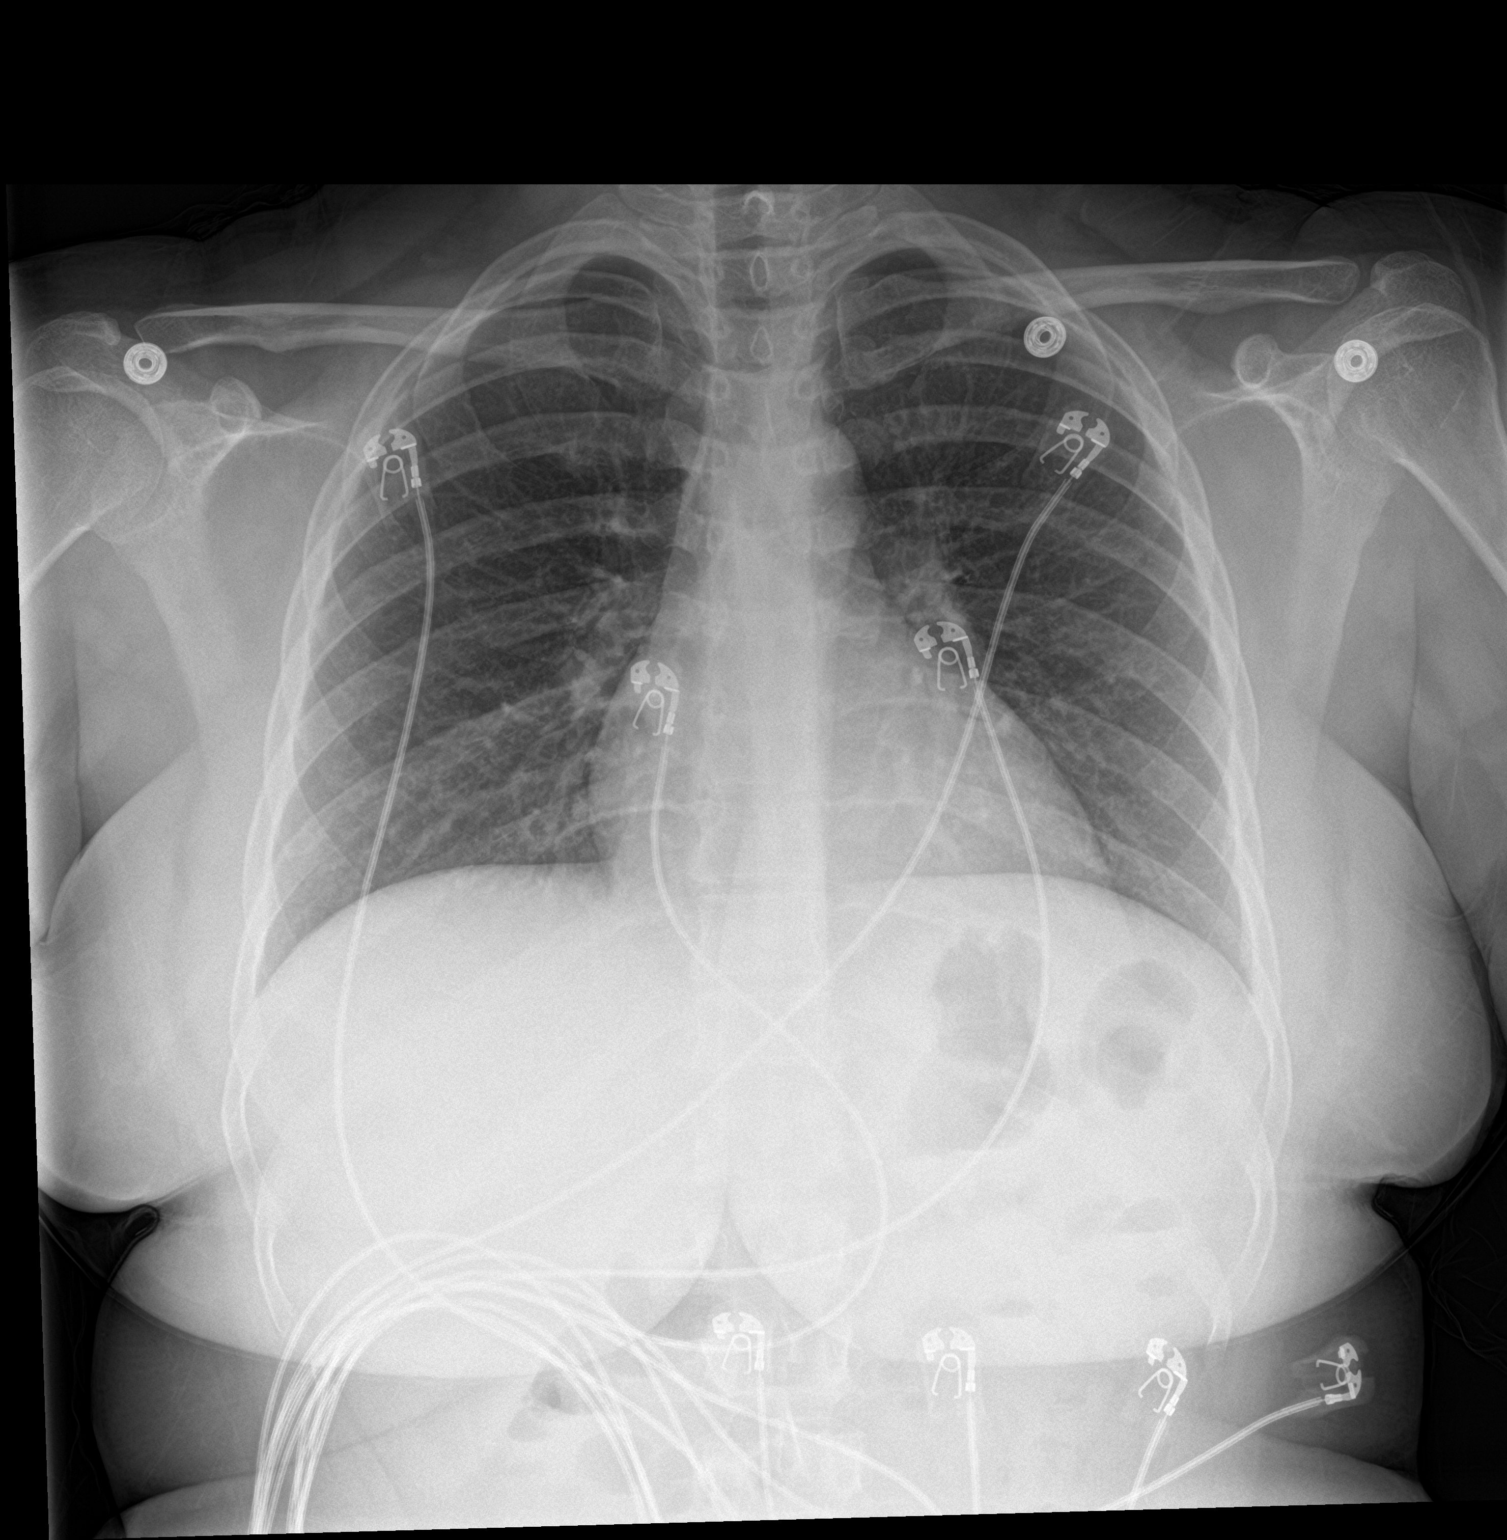

[chest lat]
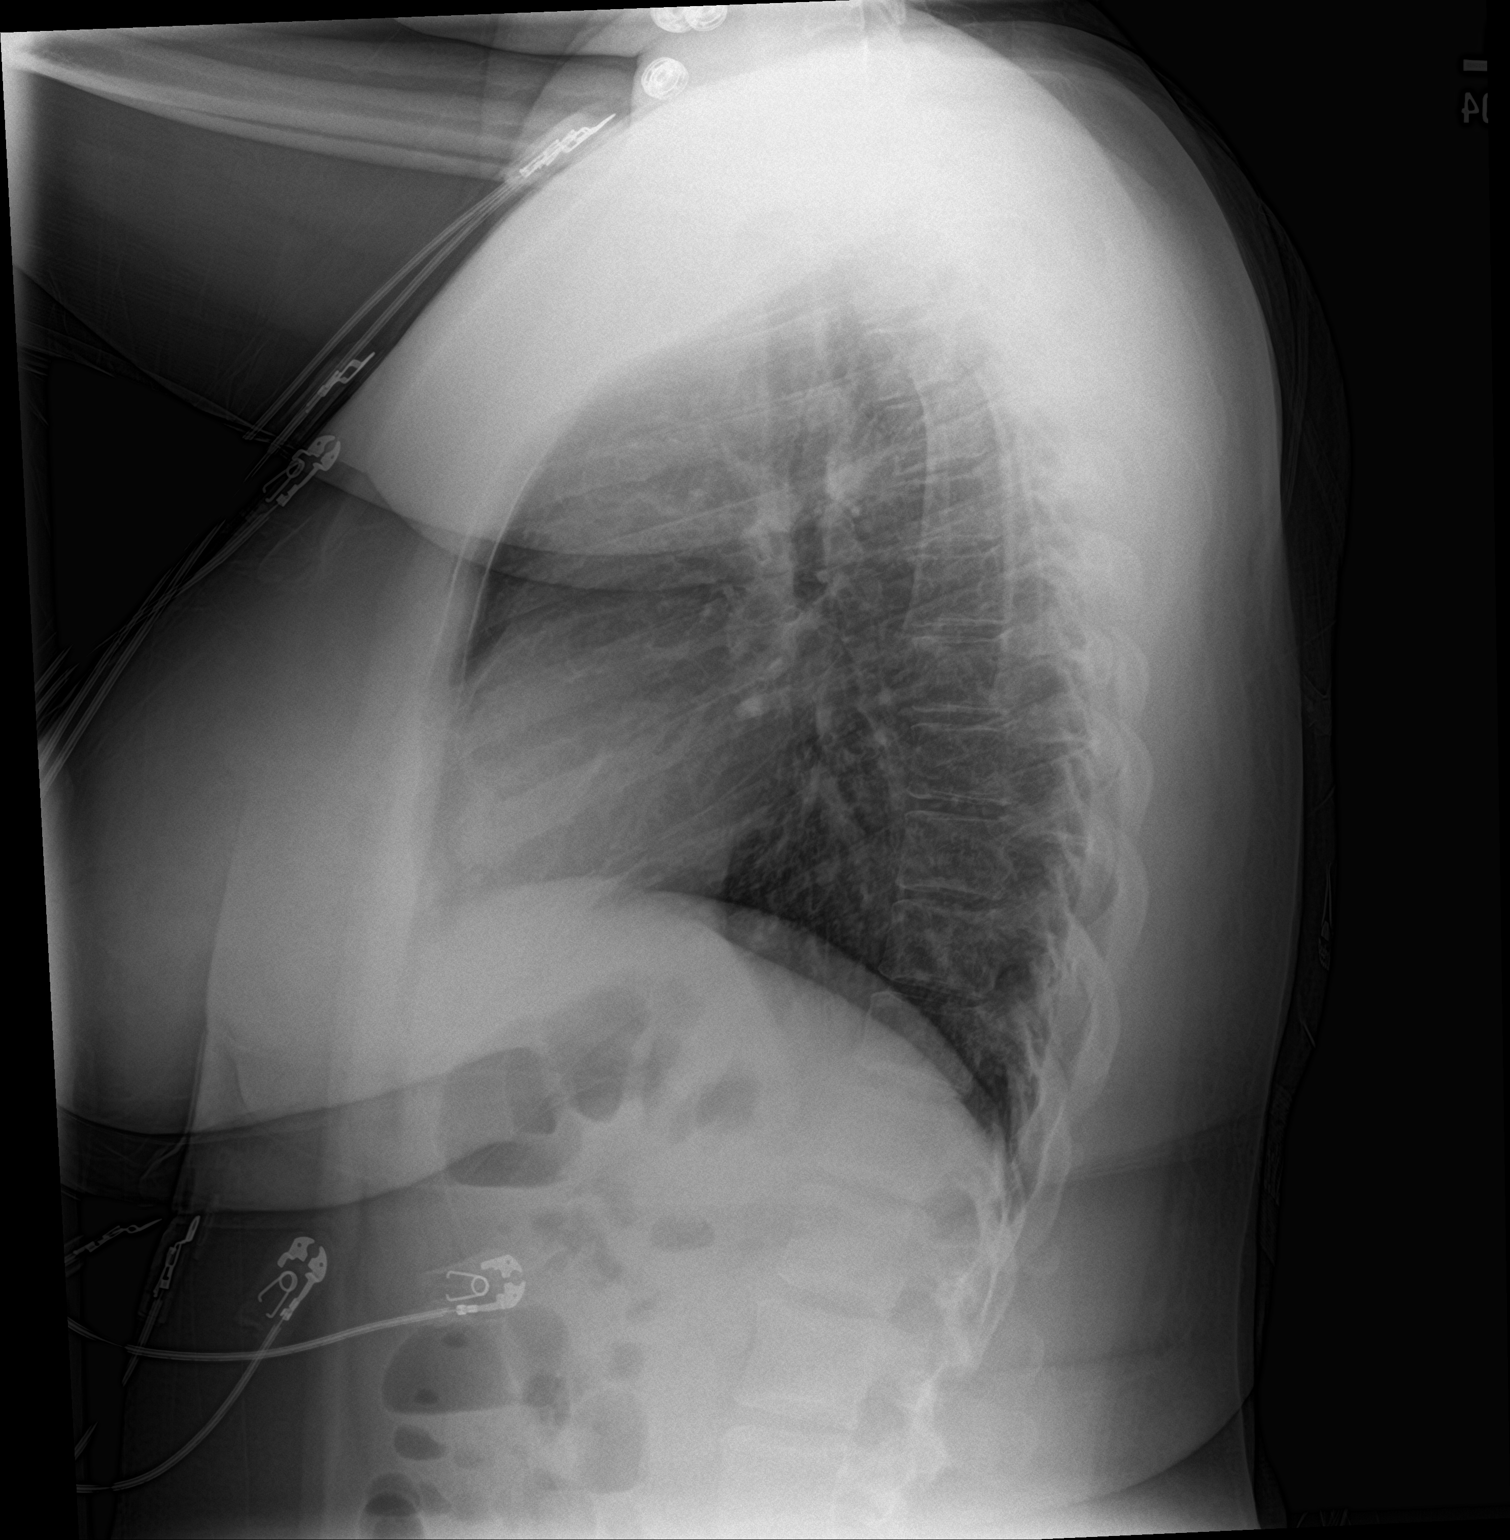

[2 of 2 positions shown; findings below may reference images not displayed]

FINDINGS: Cardiomediastinal silhouette and pulmonary vasculature are within
normal limits.

Lungs are clear.
IMPRESSION: No acute cardiopulmonary process.

## 2023-08-21 DIAGNOSIS — F4322 Adjustment disorder with anxiety: Secondary | ICD-10-CM | POA: Diagnosis not present

## 2023-09-04 DIAGNOSIS — F4322 Adjustment disorder with anxiety: Secondary | ICD-10-CM | POA: Diagnosis not present

## 2023-09-18 DIAGNOSIS — F4322 Adjustment disorder with anxiety: Secondary | ICD-10-CM | POA: Diagnosis not present

## 2023-10-02 DIAGNOSIS — F4322 Adjustment disorder with anxiety: Secondary | ICD-10-CM | POA: Diagnosis not present

## 2023-11-13 DIAGNOSIS — F4322 Adjustment disorder with anxiety: Secondary | ICD-10-CM | POA: Diagnosis not present

## 2023-12-12 DIAGNOSIS — F4322 Adjustment disorder with anxiety: Secondary | ICD-10-CM | POA: Diagnosis not present
